# Patient Record
Sex: Female | Born: 1940 | Race: White | Hispanic: No | Marital: Married | Smoking: Never smoker
Health system: Southern US, Community
[De-identification: ages and names within clinical notes are randomized; demographics above are authoritative.]

## PROBLEM LIST (undated history)

## (undated) DIAGNOSIS — E785 Hyperlipidemia, unspecified: Secondary | ICD-10-CM

## (undated) DIAGNOSIS — G43909 Migraine, unspecified, not intractable, without status migrainosus: Secondary | ICD-10-CM

## (undated) DIAGNOSIS — C801 Malignant (primary) neoplasm, unspecified: Secondary | ICD-10-CM

## (undated) DIAGNOSIS — N8110 Cystocele, unspecified: Secondary | ICD-10-CM

## (undated) DIAGNOSIS — M1990 Unspecified osteoarthritis, unspecified site: Secondary | ICD-10-CM

## (undated) DIAGNOSIS — L4050 Arthropathic psoriasis, unspecified: Secondary | ICD-10-CM

## (undated) DIAGNOSIS — C50919 Malignant neoplasm of unspecified site of unspecified female breast: Secondary | ICD-10-CM

## (undated) DIAGNOSIS — M170 Bilateral primary osteoarthritis of knee: Secondary | ICD-10-CM

## (undated) DIAGNOSIS — N200 Calculus of kidney: Secondary | ICD-10-CM

## (undated) DIAGNOSIS — R011 Cardiac murmur, unspecified: Secondary | ICD-10-CM

## (undated) HISTORY — DX: Bilateral primary osteoarthritis of knee: M17.0

## (undated) HISTORY — DX: Calculus of kidney: N20.0

## (undated) HISTORY — PX: TONSILLECTOMY: SUR1361

## (undated) HISTORY — DX: Cardiac murmur, unspecified: R01.1

## (undated) HISTORY — DX: Unspecified osteoarthritis, unspecified site: M19.90

## (undated) HISTORY — PX: TYMPANOSTOMY TUBE PLACEMENT: SHX32

## (undated) HISTORY — DX: Cystocele, unspecified: N81.10

## (undated) HISTORY — PX: CATARACT EXTRACTION: SUR2

## (undated) HISTORY — PX: TOTAL KNEE ARTHROPLASTY: SHX125

## (undated) HISTORY — DX: Hyperlipidemia, unspecified: E78.5

## (undated) HISTORY — DX: Migraine, unspecified, not intractable, without status migrainosus: G43.909

## (undated) HISTORY — DX: Arthropathic psoriasis, unspecified: L40.50

## (undated) HISTORY — DX: Malignant (primary) neoplasm, unspecified: C80.1

---

## 1987-03-27 HISTORY — PX: BREAST CYST EXCISION: SHX579

## 1992-03-26 DIAGNOSIS — C50919 Malignant neoplasm of unspecified site of unspecified female breast: Secondary | ICD-10-CM

## 1992-03-26 HISTORY — PX: BREAST LUMPECTOMY: SHX2

## 1992-03-26 HISTORY — DX: Malignant neoplasm of unspecified site of unspecified female breast: C50.919

## 2009-03-26 LAB — HM COLONOSCOPY

## 2013-01-02 ENCOUNTER — Encounter: Payer: Self-pay | Admitting: Family

## 2013-01-02 ENCOUNTER — Encounter (INDEPENDENT_AMBULATORY_CARE_PROVIDER_SITE_OTHER): Payer: Self-pay

## 2013-01-02 ENCOUNTER — Ambulatory Visit (INDEPENDENT_AMBULATORY_CARE_PROVIDER_SITE_OTHER): Payer: Medicare HMO | Admitting: Family

## 2013-01-02 VITALS — BP 126/80 | HR 69 | Ht 64.5 in | Wt 146.0 lb

## 2013-01-02 DIAGNOSIS — E7800 Pure hypercholesterolemia, unspecified: Secondary | ICD-10-CM | POA: Insufficient documentation

## 2013-01-02 DIAGNOSIS — M8580 Other specified disorders of bone density and structure, unspecified site: Secondary | ICD-10-CM

## 2013-01-02 DIAGNOSIS — Z23 Encounter for immunization: Secondary | ICD-10-CM

## 2013-01-02 DIAGNOSIS — M899 Disorder of bone, unspecified: Secondary | ICD-10-CM

## 2013-01-02 DIAGNOSIS — Z853 Personal history of malignant neoplasm of breast: Secondary | ICD-10-CM

## 2013-01-02 DIAGNOSIS — L4050 Arthropathic psoriasis, unspecified: Secondary | ICD-10-CM

## 2013-01-02 DIAGNOSIS — M069 Rheumatoid arthritis, unspecified: Secondary | ICD-10-CM | POA: Insufficient documentation

## 2013-01-02 MED ORDER — LOVASTATIN 40 MG PO TABS: 40.0000 mg | ORAL_TABLET | Freq: Every day | ORAL | Status: AC

## 2013-01-02 MED ORDER — CLOBETASOL PROPIONATE 0.05 % EX SOLN: 1.0000 "application " | Freq: Two times a day (BID) | CUTANEOUS | Status: DC

## 2013-01-02 MED ORDER — CLOBETASOL PROPIONATE 0.05 % EX SOLN: 1.0000 "application " | Freq: Two times a day (BID) | CUTANEOUS | Status: AC

## 2013-01-02 NOTE — Progress Notes (Signed)
  Subjective:    Patient ID: Chelsea Mayo, female    DOB: 1940/07/15, 72 y.o.   MRN: [id number]  HPI 72 year old white female, nonsmoker and sent as a new patient to be established. She has a history of psoriatic arthritis, osteopenia, hyperlipidemia. She is relocating here from [place]. She tolerates her medications well. Was seeing her primary care provider there every 3 months last labs were on August. Last colonoscopy 2011. Bone density 5 years ago and had a mammogram in June 2014. Denies any concerns today.   Review of Systems  Constitutional: Negative.   HENT: Negative.   Respiratory: Negative.   Cardiovascular: Negative.   Gastrointestinal: Negative.   Endocrine: Negative.   Genitourinary: Negative.   Musculoskeletal: Negative.        Psoriatic arthritis controlled with methotrexate  Skin: Negative.        Psoriasis  Neurological: Negative.   Hematological: Negative.   Psychiatric/Behavioral: Negative.    Past Medical History  Diagnosis Date  . Arthritis   . Cancer     breast  . Hyperlipidemia   . Heart murmur   . Migraines   . Kidney stones   . Female bladder prolapse   . Psoriatic arthritis   . Osteoarthritis of both knees     History   Social History  . Marital Status: Married    Spouse Name: N/A    Number of Children: N/A  . Years of Education: N/A   Occupational History  . Not on file.   Social History Main Topics  . Smoking status: Never Smoker   . Smokeless tobacco: Not on file  . Alcohol Use: Yes  . Drug Use: No  . Sexual Activity: Not on file   Other Topics Concern  . Not on file   Social History Narrative  . No narrative on file    Past Surgical History  Procedure Laterality Date  . Breast lumpectomy    . Tonsillectomy    . Total knee arthroplasty    . Cataract extraction    . Tympanostomy tube placement      No family history on file.  Allergies  Allergen Reactions  . Morphine And Related   . Plaquenil [Hydroxychloroquine  Sulfate]     No current outpatient prescriptions on file prior to visit.   No current facility-administered medications on file prior to visit.    BP 126/80  Pulse 69  Ht 5' 4.5" (1.638 m)  Wt 146 lb (66.225 kg)  BMI 24.68 kg/m2chart    Objective:   Physical Exam  Constitutional: She is oriented to person, place, and time. She appears well-developed and well-nourished.  HENT:  Right Ear: External ear normal.  Left Ear: External ear normal.  Mouth/Throat: Oropharynx is clear and moist.  Neck: Normal range of motion.  Cardiovascular: Normal rate, regular rhythm and normal heart sounds.   Pulmonary/Chest: Effort normal and breath sounds normal.  Abdominal: Soft. Bowel sounds are normal.  Musculoskeletal: Normal range of motion.  Neurological: She is alert and oriented to person, place, and time.  Skin: Skin is warm and dry.  Psychiatric: She has a normal mood and affect.          Assessment & Plan:  Assessment: 1. Psoriatic arthritis 2. Osteopenia 3. Hyperlipidemia 4. History of breast cancer  Plan: Refer to rheumatology for management of methotrexate for psoriatic arthritis. Continue calcium and vitamin D. Continue lovastatin prescription sent to the pharmacy. Herself to diet, exercise, monthly self breast exams.

## 2013-01-02 NOTE — Addendum Note (Signed)
Addended by: Beverely Low on: 01/02/2013 10:04 AM   Modules accepted: Orders

## 2013-01-07 ENCOUNTER — Encounter: Payer: Self-pay | Admitting: Family

## 2013-04-07 ENCOUNTER — Ambulatory Visit: Payer: Medicare HMO | Admitting: Family

## 2013-05-13 ENCOUNTER — Telehealth: Payer: Self-pay | Admitting: Family

## 2013-05-13 NOTE — Telephone Encounter (Signed)
Patient Information:  Caller Name: Samyra  Phone: [phone]  Patient: Alicea, Wente  Gender: Female  DOB: 07/11/1940  Age: 73 Years  PCP: [facility])  Office Follow Up:  Does the office need to follow up with this patient?: No  Instructions For The Office: N/A   Symptoms  Reason For Call & Symptoms: Jenniger states she had fever and "feelling bad" on 05/13/13. States she went to [facility] today 05/13/13 and tested positive for influenza. States she was ordered Tamiflu and Benzonatate for cough / sore throat.  No further needs at this time. Advised Shandi to callback if needed.  Reviewed Health History In EMR: Yes  Reviewed Medications In EMR: Yes  Reviewed Allergies In EMR: Yes  Reviewed Surgeries / Procedures: Yes  Date of Onset of Symptoms: Unknown  Guideline(s) Used:  No Protocol Available - Sick Adult  Disposition Per Guideline:   Home Care  Reason For Disposition Reached:   Patient's symptoms are safe to treat at home per nursing judgment  Advice Given:  N/A  Patient Will Follow Care Advice:  YES

## 2013-05-15 NOTE — Telephone Encounter (Signed)
fyi

## 2013-11-04 ENCOUNTER — Other Ambulatory Visit: Payer: Self-pay | Admitting: Physician Assistant

## 2013-11-04 DIAGNOSIS — Z780 Asymptomatic menopausal state: Secondary | ICD-10-CM

## 2013-11-04 DIAGNOSIS — Z1231 Encounter for screening mammogram for malignant neoplasm of breast: Secondary | ICD-10-CM

## 2013-12-02 ENCOUNTER — Ambulatory Visit
Admission: RE | Admit: 2013-12-02 | Discharge: 2013-12-02 | Disposition: A | Discharge status: Home / Self Care | Payer: Self-pay | Source: Ambulatory Visit | Attending: Physician Assistant | Admitting: Physician Assistant

## 2013-12-02 ENCOUNTER — Other Ambulatory Visit: Payer: Self-pay | Admitting: Physician Assistant

## 2013-12-02 ENCOUNTER — Ambulatory Visit
Admission: RE | Admit: 2013-12-02 | Discharge: 2013-12-02 | Disposition: A | Discharge status: Home / Self Care | Payer: Medicare HMO | Source: Ambulatory Visit | Attending: Physician Assistant | Admitting: Physician Assistant

## 2013-12-02 DIAGNOSIS — Z853 Personal history of malignant neoplasm of breast: Secondary | ICD-10-CM

## 2013-12-02 DIAGNOSIS — Z1231 Encounter for screening mammogram for malignant neoplasm of breast: Secondary | ICD-10-CM

## 2014-11-02 ENCOUNTER — Other Ambulatory Visit: Payer: Self-pay | Admitting: Gastroenterology

## 2014-11-02 ENCOUNTER — Ambulatory Visit (HOSPITAL_COMMUNITY): Payer: Medicare HMO

## 2014-11-02 ENCOUNTER — Other Ambulatory Visit (HOSPITAL_COMMUNITY): Payer: Self-pay | Admitting: Gastroenterology

## 2014-11-02 ENCOUNTER — Ambulatory Visit (HOSPITAL_COMMUNITY)
Admission: RE | Admit: 2014-11-02 | Discharge: 2014-11-02 | Disposition: A | Discharge status: Home / Self Care | Payer: Medicare HMO | Source: Ambulatory Visit | Attending: Gastroenterology | Admitting: Gastroenterology

## 2014-11-02 ENCOUNTER — Encounter (HOSPITAL_COMMUNITY): Payer: Self-pay

## 2014-11-02 DIAGNOSIS — R933 Abnormal findings on diagnostic imaging of other parts of digestive tract: Secondary | ICD-10-CM

## 2014-11-02 DIAGNOSIS — K8020 Calculus of gallbladder without cholecystitis without obstruction: Secondary | ICD-10-CM | POA: Diagnosis not present

## 2014-11-02 DIAGNOSIS — K449 Diaphragmatic hernia without obstruction or gangrene: Secondary | ICD-10-CM | POA: Diagnosis not present

## 2014-11-02 DIAGNOSIS — C184 Malignant neoplasm of transverse colon: Secondary | ICD-10-CM | POA: Insufficient documentation

## 2014-11-02 DIAGNOSIS — Z853 Personal history of malignant neoplasm of breast: Secondary | ICD-10-CM | POA: Insufficient documentation

## 2014-11-02 DIAGNOSIS — I700 Atherosclerosis of aorta: Secondary | ICD-10-CM | POA: Insufficient documentation

## 2014-11-02 DIAGNOSIS — D1809 Hemangioma of other sites: Secondary | ICD-10-CM | POA: Insufficient documentation

## 2014-11-02 DIAGNOSIS — R918 Other nonspecific abnormal finding of lung field: Secondary | ICD-10-CM | POA: Diagnosis not present

## 2014-11-02 DIAGNOSIS — R160 Hepatomegaly, not elsewhere classified: Secondary | ICD-10-CM | POA: Diagnosis not present

## 2014-11-02 DIAGNOSIS — N281 Cyst of kidney, acquired: Secondary | ICD-10-CM | POA: Diagnosis not present

## 2014-11-02 DIAGNOSIS — K5730 Diverticulosis of large intestine without perforation or abscess without bleeding: Secondary | ICD-10-CM | POA: Insufficient documentation

## 2014-11-02 DIAGNOSIS — C189 Malignant neoplasm of colon, unspecified: Secondary | ICD-10-CM

## 2014-11-02 LAB — POCT I-STAT CREATININE: CREATININE: 0.8 mg/dL (ref 0.44–1.00)

## 2014-11-02 MED ORDER — IOHEXOL 300 MG/ML  SOLN
100.0000 mL | Freq: Once | INTRAMUSCULAR | Status: AC | PRN
  Administered 2014-11-02: 100 mL via INTRAVENOUS

## 2014-11-02 MED ORDER — IOHEXOL 300 MG/ML  SOLN
50.0000 mL | Freq: Once | INTRAMUSCULAR | Status: AC | PRN
  Administered 2014-11-02: 50 mL via ORAL

## 2014-11-12 ENCOUNTER — Telehealth: Payer: Self-pay | Admitting: *Deleted

## 2014-11-12 ENCOUNTER — Other Ambulatory Visit: Payer: Self-pay | Admitting: General Surgery

## 2014-11-12 DIAGNOSIS — C799 Secondary malignant neoplasm of unspecified site: Secondary | ICD-10-CM

## 2014-11-12 NOTE — Telephone Encounter (Signed)
Oncology Nurse Navigator Documentation  Oncology Nurse Navigator Flowsheets 11/12/2014  Referral date to RadOnc/MedOnc 11/11/2014  Navigator Encounter Type Introductory phone call  Called and made her aware that referral has been received. Inquired if she wants to be seen at [facility] by Dr. Whitney Muse since she lives in [place]? She reports they are on outskirts of [place] is just as close for her. Requests to be seen in [place]. Made her aware that she will be called after MD reviews chart information.

## 2014-11-17 ENCOUNTER — Telehealth: Payer: Self-pay | Admitting: *Deleted

## 2014-11-17 NOTE — Telephone Encounter (Signed)
  Oncology Nurse Navigator Documentation    Navigator Encounter Type: Telephone (11/17/14 1149)  Left VM for patient to call and confirm her appointment with Dr. Burr Medico on 11/22/14 at 3:15 pm. Will also send her a MyChart message.

## 2014-11-17 NOTE — Telephone Encounter (Signed)
Return call from [place] to confirm her appointment with Dr. Burr Medico on 11/22/14.

## 2014-11-19 ENCOUNTER — Other Ambulatory Visit: Payer: Self-pay

## 2014-11-19 DIAGNOSIS — Z1231 Encounter for screening mammogram for malignant neoplasm of breast: Secondary | ICD-10-CM

## 2014-11-22 ENCOUNTER — Ambulatory Visit: Payer: Medicare HMO | Admitting: Hematology

## 2014-11-22 ENCOUNTER — Ambulatory Visit: Payer: Medicare HMO

## 2014-12-06 ENCOUNTER — Ambulatory Visit
Admission: RE | Admit: 2014-12-06 | Discharge: 2014-12-06 | Disposition: A | Discharge status: Home / Self Care | Payer: Medicare HMO | Source: Ambulatory Visit

## 2014-12-06 DIAGNOSIS — Z1231 Encounter for screening mammogram for malignant neoplasm of breast: Secondary | ICD-10-CM

## 2015-01-05 ENCOUNTER — Telehealth: Payer: Self-pay | Admitting: *Deleted

## 2015-01-05 NOTE — Telephone Encounter (Signed)
Noted patient saw Dr. Karle Starch at [facility] and is planning for [facility] and [facility]. Sent Mychart message to confirm she will follow up there instead of with Dr. Burr Medico.

## 2015-11-03 ENCOUNTER — Other Ambulatory Visit: Payer: Self-pay | Admitting: Physician Assistant

## 2015-11-03 DIAGNOSIS — Z1231 Encounter for screening mammogram for malignant neoplasm of breast: Secondary | ICD-10-CM

## 2015-12-08 ENCOUNTER — Ambulatory Visit
Admission: RE | Admit: 2015-12-08 | Discharge: 2015-12-08 | Disposition: A | Discharge status: Home / Self Care | Payer: Medicare HMO | Source: Ambulatory Visit | Attending: Physician Assistant | Admitting: Physician Assistant

## 2015-12-08 DIAGNOSIS — Z1231 Encounter for screening mammogram for malignant neoplasm of breast: Secondary | ICD-10-CM

## 2016-11-13 ENCOUNTER — Other Ambulatory Visit: Payer: Self-pay | Admitting: Physician Assistant

## 2016-11-13 DIAGNOSIS — Z1231 Encounter for screening mammogram for malignant neoplasm of breast: Secondary | ICD-10-CM

## 2016-12-19 ENCOUNTER — Ambulatory Visit
Admission: RE | Admit: 2016-12-19 | Discharge: 2016-12-19 | Disposition: A | Discharge status: Home / Self Care | Payer: Medicare HMO | Source: Ambulatory Visit | Attending: Physician Assistant | Admitting: Physician Assistant

## 2016-12-19 DIAGNOSIS — Z1231 Encounter for screening mammogram for malignant neoplasm of breast: Secondary | ICD-10-CM

## 2016-12-19 HISTORY — DX: Malignant neoplasm of unspecified site of unspecified female breast: C50.919

## 2019-06-30 ENCOUNTER — Emergency Department (HOSPITAL_COMMUNITY): Payer: Medicare HMO

## 2019-06-30 ENCOUNTER — Encounter (HOSPITAL_COMMUNITY): Payer: Self-pay | Admitting: Emergency Medicine

## 2019-06-30 ENCOUNTER — Other Ambulatory Visit: Payer: Self-pay

## 2019-06-30 ENCOUNTER — Inpatient Hospital Stay (HOSPITAL_COMMUNITY)
Admission: EM | Admit: 2019-06-30 | Discharge: 2019-07-25 | DRG: 871 | Disposition: E | Payer: Medicare HMO | Attending: Internal Medicine | Admitting: Internal Medicine

## 2019-06-30 DIAGNOSIS — J9600 Acute respiratory failure, unspecified whether with hypoxia or hypercapnia: Secondary | ICD-10-CM

## 2019-06-30 DIAGNOSIS — Z853 Personal history of malignant neoplasm of breast: Secondary | ICD-10-CM

## 2019-06-30 DIAGNOSIS — F419 Anxiety disorder, unspecified: Secondary | ICD-10-CM | POA: Diagnosis present

## 2019-06-30 DIAGNOSIS — R0602 Shortness of breath: Secondary | ICD-10-CM | POA: Diagnosis not present

## 2019-06-30 DIAGNOSIS — Z7189 Other specified counseling: Secondary | ICD-10-CM

## 2019-06-30 DIAGNOSIS — G92 Toxic encephalopathy: Secondary | ICD-10-CM | POA: Diagnosis present

## 2019-06-30 DIAGNOSIS — R338 Other retention of urine: Secondary | ICD-10-CM | POA: Diagnosis not present

## 2019-06-30 DIAGNOSIS — A419 Sepsis, unspecified organism: Secondary | ICD-10-CM | POA: Diagnosis not present

## 2019-06-30 DIAGNOSIS — G9341 Metabolic encephalopathy: Secondary | ICD-10-CM | POA: Diagnosis present

## 2019-06-30 DIAGNOSIS — T402X1A Poisoning by other opioids, accidental (unintentional), initial encounter: Secondary | ICD-10-CM | POA: Diagnosis present

## 2019-06-30 DIAGNOSIS — D329 Benign neoplasm of meninges, unspecified: Secondary | ICD-10-CM | POA: Diagnosis present

## 2019-06-30 DIAGNOSIS — R339 Retention of urine, unspecified: Secondary | ICD-10-CM | POA: Diagnosis present

## 2019-06-30 DIAGNOSIS — Z7952 Long term (current) use of systemic steroids: Secondary | ICD-10-CM

## 2019-06-30 DIAGNOSIS — Z20822 Contact with and (suspected) exposure to covid-19: Secondary | ICD-10-CM | POA: Diagnosis present

## 2019-06-30 DIAGNOSIS — M1990 Unspecified osteoarthritis, unspecified site: Secondary | ICD-10-CM | POA: Diagnosis present

## 2019-06-30 DIAGNOSIS — E876 Hypokalemia: Secondary | ICD-10-CM | POA: Diagnosis not present

## 2019-06-30 DIAGNOSIS — T40601A Poisoning by unspecified narcotics, accidental (unintentional), initial encounter: Secondary | ICD-10-CM | POA: Diagnosis present

## 2019-06-30 DIAGNOSIS — G43909 Migraine, unspecified, not intractable, without status migrainosus: Secondary | ICD-10-CM | POA: Diagnosis present

## 2019-06-30 DIAGNOSIS — T402X4A Poisoning by other opioids, undetermined, initial encounter: Secondary | ICD-10-CM | POA: Diagnosis not present

## 2019-06-30 DIAGNOSIS — G8929 Other chronic pain: Secondary | ICD-10-CM | POA: Diagnosis present

## 2019-06-30 DIAGNOSIS — E7800 Pure hypercholesterolemia, unspecified: Secondary | ICD-10-CM | POA: Diagnosis present

## 2019-06-30 DIAGNOSIS — C189 Malignant neoplasm of colon, unspecified: Secondary | ICD-10-CM | POA: Diagnosis present

## 2019-06-30 DIAGNOSIS — R509 Fever, unspecified: Secondary | ICD-10-CM | POA: Diagnosis present

## 2019-06-30 DIAGNOSIS — Z66 Do not resuscitate: Secondary | ICD-10-CM | POA: Diagnosis present

## 2019-06-30 DIAGNOSIS — C799 Secondary malignant neoplasm of unspecified site: Secondary | ICD-10-CM | POA: Diagnosis present

## 2019-06-30 DIAGNOSIS — G894 Chronic pain syndrome: Secondary | ICD-10-CM | POA: Diagnosis present

## 2019-06-30 DIAGNOSIS — L4050 Arthropathic psoriasis, unspecified: Secondary | ICD-10-CM | POA: Diagnosis present

## 2019-06-30 DIAGNOSIS — G893 Neoplasm related pain (acute) (chronic): Secondary | ICD-10-CM | POA: Diagnosis present

## 2019-06-30 DIAGNOSIS — Z885 Allergy status to narcotic agent status: Secondary | ICD-10-CM

## 2019-06-30 DIAGNOSIS — Z515 Encounter for palliative care: Secondary | ICD-10-CM | POA: Diagnosis not present

## 2019-06-30 DIAGNOSIS — J189 Pneumonia, unspecified organism: Secondary | ICD-10-CM | POA: Diagnosis present

## 2019-06-30 DIAGNOSIS — T402X5A Adverse effect of other opioids, initial encounter: Secondary | ICD-10-CM | POA: Diagnosis present

## 2019-06-30 DIAGNOSIS — G9340 Encephalopathy, unspecified: Secondary | ICD-10-CM | POA: Diagnosis present

## 2019-06-30 DIAGNOSIS — Z9221 Personal history of antineoplastic chemotherapy: Secondary | ICD-10-CM

## 2019-06-30 DIAGNOSIS — Z96659 Presence of unspecified artificial knee joint: Secondary | ICD-10-CM | POA: Diagnosis present

## 2019-06-30 DIAGNOSIS — E785 Hyperlipidemia, unspecified: Secondary | ICD-10-CM | POA: Diagnosis present

## 2019-06-30 LAB — COMPREHENSIVE METABOLIC PANEL
ALT: 18 U/L (ref 0–44)
AST: 27 U/L (ref 15–41)
Albumin: 4.1 g/dL (ref 3.5–5.0)
Alkaline Phosphatase: 81 U/L (ref 38–126)
Anion gap: 12 (ref 5–15)
BUN: 17 mg/dL (ref 8–23)
CO2: 24 mmol/L (ref 22–32)
Calcium: 9.3 mg/dL (ref 8.9–10.3)
Chloride: 103 mmol/L (ref 98–111)
Creatinine, Ser: 0.79 mg/dL (ref 0.44–1.00)
GFR calc Af Amer: >60 mL/min (ref >60)
GFR calc non Af Amer: >60 mL/min (ref >60)
Glucose, Bld: 120 mg/dL — ABNORMAL HIGH (ref 70–99)
Potassium: 3.7 mmol/L (ref 3.5–5.1)
Sodium: 139 mmol/L (ref 135–145)
Total Bilirubin: 0.4 mg/dL (ref 0.3–1.2)
Total Protein: 8.1 g/dL (ref 6.5–8.1)

## 2019-06-30 LAB — CBC
HCT: 45.4 % (ref 36.0–46.0)
Hemoglobin: 13.2 g/dL (ref 12.0–15.0)
MCH: 26.7 pg (ref 26.0–34.0)
MCHC: 29.1 g/dL — ABNORMAL LOW (ref 30.0–36.0)
MCV: 91.9 fL (ref 80.0–100.0)
Platelets: 150 10*3/uL (ref 150–400)
RBC: 4.94 MIL/uL (ref 3.87–5.11)
RDW: 15.2 % (ref 11.5–15.5)
WBC: 10 10*3/uL (ref 4.0–10.5)
nRBC: 0 % (ref 0.0–0.2)

## 2019-06-30 LAB — URINALYSIS, ROUTINE W REFLEX MICROSCOPIC
Bilirubin Urine: NEGATIVE
Glucose, UA: NEGATIVE mg/dL
Hgb urine dipstick: NEGATIVE
Ketones, ur: NEGATIVE mg/dL
Leukocytes,Ua: NEGATIVE
Nitrite: NEGATIVE
Protein, ur: NEGATIVE mg/dL
Specific Gravity, Urine: 1.009 (ref 1.005–1.030)
pH: 5 (ref 5.0–8.0)

## 2019-06-30 LAB — LACTIC ACID, PLASMA
Lactic Acid, Venous: 2.4 mmol/L (ref 0.5–1.9)
Lactic Acid, Venous: 2.8 mmol/L (ref 0.5–1.9)
Lactic Acid, Venous: 3.1 mmol/L (ref 0.5–1.9)

## 2019-06-30 LAB — ACETAMINOPHEN LEVEL: Acetaminophen (Tylenol), Serum: <10 ug/mL — ABNORMAL LOW (ref 10–30)

## 2019-06-30 LAB — SALICYLATE LEVEL: Salicylate Lvl: <7 mg/dL — ABNORMAL LOW (ref 7.0–30.0)

## 2019-06-30 LAB — ETHANOL: Alcohol, Ethyl (B): <10 mg/dL (ref <10)

## 2019-06-30 LAB — RESPIRATORY PANEL BY RT PCR (FLU A&B, COVID)
Influenza A by PCR: NEGATIVE
Influenza B by PCR: NEGATIVE
SARS Coronavirus 2 by RT PCR: NEGATIVE

## 2019-06-30 LAB — CK: Total CK: 46 U/L (ref 38–234)

## 2019-06-30 MED ORDER — ACETAMINOPHEN 325 MG PO TABS: 650.0000 mg | ORAL_TABLET | Freq: Four times a day (QID) | ORAL | Status: DC | PRN

## 2019-06-30 MED ORDER — NALOXONE HCL 0.4 MG/ML IJ SOLN
INTRAMUSCULAR | Status: AC
  Administered 2019-06-30: 10:00:00 0.4 mg via INTRAVENOUS
  Filled 2019-06-30: qty 1

## 2019-06-30 MED ORDER — ONDANSETRON HCL 4 MG/2ML IJ SOLN: 4.0000 mg | Freq: Four times a day (QID) | INTRAMUSCULAR | Status: DC | PRN

## 2019-06-30 MED ORDER — SODIUM CHLORIDE 0.9 % IV BOLUS
500.0000 mL | Freq: Once | INTRAVENOUS | Status: AC
  Administered 2019-06-30: 500 mL via INTRAVENOUS

## 2019-06-30 MED ORDER — KETOROLAC TROMETHAMINE 30 MG/ML IJ SOLN
30.0000 mg | Freq: Once | INTRAMUSCULAR | Status: AC
  Administered 2019-06-30: 30 mg via INTRAVENOUS
  Filled 2019-06-30: qty 1

## 2019-06-30 MED ORDER — NALOXONE HCL 4 MG/10ML IJ SOLN
1.5000 mg/h | INTRAVENOUS | Status: DC
  Administered 2019-06-30 (×2): 1.5 mg/h via INTRAVENOUS
  Filled 2019-06-30: qty 10

## 2019-06-30 MED ORDER — LACTATED RINGERS IV SOLN: INTRAVENOUS | Status: DC

## 2019-06-30 MED ORDER — NALOXONE HCL 4 MG/10ML IJ SOLN
1.0000 mg/h | INTRAVENOUS | Status: DC
  Administered 2019-06-30: 1 mg/h via INTRAVENOUS
  Filled 2019-06-30: qty 4
  Filled 2019-06-30: qty 10

## 2019-06-30 MED ORDER — ENOXAPARIN SODIUM 40 MG/0.4ML ~~LOC~~ SOLN
40.0000 mg | SUBCUTANEOUS | Status: DC
  Administered 2019-06-30 – 2019-07-02 (×3): 40 mg via SUBCUTANEOUS
  Filled 2019-06-30 (×3): qty 0.4

## 2019-06-30 MED ORDER — VANCOMYCIN HCL 1250 MG/250ML IV SOLN
1250.0000 mg | INTRAVENOUS | Status: DC
  Administered 2019-06-30 – 2019-07-01 (×2): 1250 mg via INTRAVENOUS
  Filled 2019-06-30 (×2): qty 250

## 2019-06-30 MED ORDER — NALOXONE HCL 2 MG/2ML IJ SOSY
PREFILLED_SYRINGE | INTRAMUSCULAR | Status: AC
  Filled 2019-06-30: qty 8

## 2019-06-30 MED ORDER — LACTATED RINGERS IV BOLUS
1000.0000 mL | Freq: Once | INTRAVENOUS | Status: AC
  Administered 2019-06-30: 1000 mL via INTRAVENOUS

## 2019-06-30 MED ORDER — NALOXONE HCL 0.4 MG/ML IJ SOLN
0.4000 mg | INTRAMUSCULAR | Status: DC | PRN
  Administered 2019-06-30 (×3): 0.4 mg via INTRAVENOUS
  Filled 2019-06-30 (×3): qty 1

## 2019-06-30 MED ORDER — LORAZEPAM 2 MG/ML IJ SOLN
0.5000 mg | Freq: Once | INTRAMUSCULAR | Status: AC
  Administered 2019-06-30: 0.5 mg via INTRAVENOUS
  Filled 2019-06-30: qty 1

## 2019-06-30 MED ORDER — ONDANSETRON HCL 4 MG PO TABS: 4.0000 mg | ORAL_TABLET | Freq: Four times a day (QID) | ORAL | Status: DC | PRN

## 2019-06-30 MED ORDER — SODIUM CHLORIDE 0.9 % IV SOLN
2.0000 g | Freq: Two times a day (BID) | INTRAVENOUS | Status: DC
  Administered 2019-06-30 – 2019-07-01 (×2): 2 g via INTRAVENOUS
  Filled 2019-06-30 (×2): qty 2

## 2019-06-30 MED ORDER — CHLORHEXIDINE GLUCONATE CLOTH 2 % EX PADS
6.0000 | MEDICATED_PAD | Freq: Every day | CUTANEOUS | Status: DC
  Administered 2019-06-30 – 2019-07-03 (×4): 6 via TOPICAL

## 2019-06-30 MED ORDER — ACETAMINOPHEN 650 MG RE SUPP
650.0000 mg | Freq: Four times a day (QID) | RECTAL | Status: DC | PRN
  Administered 2019-06-30 – 2019-07-03 (×4): 650 mg via RECTAL
  Filled 2019-06-30 (×4): qty 1

## 2019-06-30 NOTE — Progress Notes (Addendum)
Pharmacy Antibiotic Note  Chelsea Mayo is a 79 y.o. female admitted on 07/11/2019 with sepsis.  Pharmacy has been consulted for vanc/cefepime dosing.  Pt with a hx of colon CA who presented to the ED with AMS. This could also be attributed to medication overdose according to MD. She was found to be febrile in the ED. He lactate came back elevated so vanc and cefepime ordered to r/o sepsis.   Scr 0.79 ALT wnl COVID neg Wbc 10  Plan: Vanc 1.25g IV q24>>AUC 429, scr 0.79 Cefepime 2g IV q12 Levels as needed F/u with MRSA PCR  Height: 5\' 4"  (162.6 cm) Weight: 64.4 kg (141 lb 15.6 oz) IBW/kg (Calculated) : 54.7  Temp (24hrs), Avg:99.4 F (37.4 C), Min:97.2 F (36.2 C), Max:101.8 F (38.8 C)  Recent Labs  Lab 06/25/2019 1021 07/13/2019 1821  WBC 10.0  --   CREATININE 0.79  --   LATICACIDVEN 2.4* 2.8*    Estimated Creatinine Clearance: 50.9 mL/min (by C-G formula based on SCr of 0.79 mg/dL).    Allergies  Allergen Reactions  . Morphine And Related   . Plaquenil [Hydroxychloroquine Sulfate]   . Trazodone Other (See Comments)    Felt very weird    Antimicrobials this admission: 4/6 cefepime>> 4/6 vanc>>  Dose adjustments this admission:   Microbiology results: 4/6 MRSA>> 4/6 Flu neg 4/6 COVID neg 4/6 blood>>  Onnie Boer, PharmD, [place], AAHIVP, CPP Infectious Disease Pharmacist 07/13/2019 7:20 PM

## 2019-06-30 NOTE — ED Provider Notes (Signed)
[facility] Emergency Department Provider Note MRN:  [id number]  Arrival date & time: 07/17/2019     Chief Complaint   Unresponsiveness History of Present Illness   Chelsea Mayo is a 79 y.o. year-old female with a history of metastatic colon cancer presenting to the ED with chief complaint of unresponsiveness.  History obtained from EMS and patient's husband as patient is unresponsive.  Patient has multiple sedating prescriptions at home, including oxycodone and Valium to control her pain related to her cancer.  Husband and patient sleep in different rooms so as to not disturb each other's sleep.  Patient did not wake up at her normal time, husband checked on her and she was on the ground next to her bed, breathing shallowly, not waking up.  EMS arrived and patient required bag-valve-mask, became more responsive with stronger breaths after dose of intravenous Narcan.  I was unable to obtain an accurate HPI, PMH, or ROS due to the patient's unresponsiveness.  Level 5 caveat.  Review of Systems  Positive for unresponsiveness, altered mental status.  Patient's Health History    Past Medical History:  Diagnosis Date  . Arthritis   . breast ca dx'd 1995   right lumpectomy w/ xrt  . Breast cancer ([place]) 1994   right breast  . Female bladder prolapse   . Heart murmur   . Hyperlipidemia   . Kidney stones   . Migraines   . Osteoarthritis of both knees   . Psoriatic arthritis [facility])     Past Surgical History:  Procedure Laterality Date  . BREAST CYST EXCISION Right 1989  . BREAST LUMPECTOMY Right 1994  . CATARACT EXTRACTION    . TONSILLECTOMY    . TOTAL KNEE ARTHROPLASTY    . TYMPANOSTOMY TUBE PLACEMENT      History reviewed. No pertinent family history.  Social History   Socioeconomic History  . Marital status: Married    Spouse name: Not on file  . Number of children: Not on file  . Years of education: Not on file  . Highest education level: Not on  file  Occupational History  . Not on file  Tobacco Use  . Smoking status: Never Smoker  . Smokeless tobacco: Never Used  Substance and Sexual Activity  . Alcohol use: Yes  . Drug use: No  . Sexual activity: Not on file  Other Topics Concern  . Not on file  Social History Narrative  . Not on file   Social Determinants of Health   Financial Resource Strain:   . Difficulty of Paying Living Expenses:   Food Insecurity:   . Worried About Charity fundraiser in the Last Year:   . Arboriculturist in the Last Year:   Transportation Needs:   . Film/video editor (Medical):   Marland Kitchen Lack of Transportation (Non-Medical):   Physical Activity:   . Days of Exercise per Week:   . Minutes of Exercise per Session:   Stress:   . Feeling of Stress :   Social Connections:   . Frequency of Communication with Friends and Family:   . Frequency of Social Gatherings with Friends and Family:   . Attends Religious Services:   . Active Member of Clubs or Organizations:   . Attends Archivist Meetings:   Marland Kitchen Marital Status:   Intimate Partner Violence:   . Fear of Current or Ex-Partner:   . Emotionally Abused:   Marland Kitchen Physically Abused:   . Sexually Abused:  Physical Exam   Vitals:   07/03/2019 0949 06/29/2019 1000  BP:  98/84  Pulse:  70  Resp:  20  Temp: (!) 97.3 F (36.3 C)   SpO2:  95%    CONSTITUTIONAL: Ill-appearing NEURO: Somnolent, difficult to wake, withdrawals extremities from pain EYES: Pupils 1 mm ENT/NECK:  no LAD, no JVD CARDIO: Regular rate, well-perfused, normal S1 and S2 PULM: Infrequent shallow breaths GI/GU:  normal bowel sounds, non-distended, non-tender MSK/SPINE:  No gross deformities, no edema SKIN:  no rash, atraumatic PSYCH:  Appropriate speech and behavior  *Additional and/or pertinent findings included in MDM below  Diagnostic and Interventional Summary    EKG Interpretation  Date/Time:  Tuesday June 30 2019 09:30:21 EDT Ventricular Rate:   75 PR Interval:    QRS Duration: 106 QT Interval:  410 QTC Calculation: 458 R Axis:   -5 Text Interpretation: Sinus rhythm Consider left atrial enlargement Abnormal R-wave progression, early transition Nonspecific T abnormalities, lateral leads No previous ECGs available Confirmed by Gerlene Fee [phone]) on 06/28/2019 9:37:26 AM      Labs Reviewed  CBC - Abnormal; Notable for the following components:      Result Value   MCHC 29.1 (*)    All other components within normal limits  COMPREHENSIVE METABOLIC PANEL - Abnormal; Notable for the following components:   Glucose, Bld 120 (*)    All other components within normal limits  SALICYLATE LEVEL - Abnormal; Notable for the following components:   Salicylate Lvl [id number] (*)    All other components within normal limits  ACETAMINOPHEN LEVEL - Abnormal; Notable for the following components:   Acetaminophen (Tylenol), Serum <10 (*)    All other components within normal limits  LACTIC ACID, PLASMA - Abnormal; Notable for the following components:   Lactic Acid, Venous 2.4 (*)    All other components within normal limits  RESPIRATORY PANEL BY RT PCR (FLU A&B, COVID)  ETHANOL  URINALYSIS, ROUTINE W REFLEX MICROSCOPIC  CK    DG Chest 1 View  Final Result    CT HEAD WO CONTRAST  Final Result      Medications  naloxone (NARCAN) injection 0.4 mg (0.4 mg Intravenous Given 07/03/2019 1100)  naloxone HCl (NARCAN) 4 mg in dextrose 5 % 250 mL infusion (has no administration in time range)  sodium chloride 0.9 % bolus 500 mL (500 mLs Intravenous New Bag/Given 07/07/2019 1007)     Procedures  /  Critical Care .Critical Care Performed by: Maudie Flakes, MD Authorized by: Maudie Flakes, MD   Critical care provider statement:    Critical care time (minutes):  45   Critical care was necessary to treat or prevent imminent or life-threatening deterioration of the following conditions:  Toxidrome and respiratory failure   Critical care was time  spent personally by me on the following activities:  Discussions with consultants, evaluation of patient's response to treatment, examination of patient, ordering and performing treatments and interventions, ordering and review of laboratory studies, ordering and review of radiographic studies, pulse oximetry, re-evaluation of patient's condition, obtaining history from patient or surrogate and review of old charts    ED Course and Medical Decision Making  I have reviewed the triage vital signs, the nursing notes, and pertinent available records from the EMR.  Pertinent labs & imaging results that were available during my care of the patient were reviewed by me and considered in my medical decision making (see below for details).     Patient arriving  very somnolent, reportedly responded well to Narcan but on arrival is difficult to wake, pupils pinpoint, and frequent shallow breaths.  An additional IV dose of 0.4 mg Narcan was provided shortly after arrival, and patient responded well with eyes opened, still altered but with stronger respiratory effort.  Will need very close monitoring for presumed opioid overdose, likely unintentional in the setting of chronic pain from cancer.  Question of unwitnessed fall, will also obtain CT head, laboratory evaluation given that patient endorsed to husband that she felt generally unwell last night.  11:20 AM update: Patient has required 5 more doses of 0.4 mg IV Narcan to maintain her respiratory effort.  Narcan drip ordered at 1 mg/h, admitted to hospitalist service for further care, likely stepdown unit.  Barth Kirks. Sedonia Small, [place] [email or web address]  Final Clinical Impressions(s) / ED Diagnoses     ICD-10-CM   1. Opioid overdose, undetermined intent, initial encounter ([place])  T40.2X4A   2. SOB (shortness of breath)  R06.02 DG Chest 1 View    DG Chest 1 View  3. Acute respiratory failure, unspecified  whether with hypoxia or hypercapnia ([place])  J96.00     ED Discharge Orders    None       Discharge Instructions Discussed with and Provided to Patient:   Discharge Instructions   None       Maudie Flakes, MD 07/09/2019 1120

## 2019-06-30 NOTE — Progress Notes (Signed)
Rectal temp checked and showed 101.8. Tylenol suppository administered. Will recheck temp. Pt noted to also have not made any urine since arrival to unit at 1300. Bladder scan revealed >454 urine retention. In and Out cath performed per protocol. Total of 700 mL urine output collected. Dr. Roderic Palau notified.

## 2019-06-30 NOTE — ED Notes (Signed)
Pt in bed, monitors remain in place, bed in low position, call bell within reach, pt opens eyes to loud verbal stim or tactile stim.

## 2019-06-30 NOTE — Progress Notes (Signed)
Patient has not urinated this shift, bladder can showed >362 mL urine. Patient also very restless and agitated, midlevel aware. Orders placed and followed.

## 2019-06-30 NOTE — ED Triage Notes (Signed)
PT brought in by [facility] today from home. EMS reported her husband called EMS today after finding the patient in the floor and not responsive. On EMS arrival pt was being bagged with bag-valve mask by first responders and EMS gave .4 Narcan IV and pt began breathing more on her own. PT has stage IV colon cancer per EMS and is prescribed Roxicodone and valium at home. PT did vomit at home with EMS. PT arrived on 4L n/c oxygen but still lethargic and oxygen sats were in the 80s. EDP at bedside at this time.

## 2019-06-30 NOTE — Plan of Care (Signed)
CRITICAL VALUE ALERT  Critical Value:  Lactic 2.4  Date & Time Notied:  07/24/2019 1106  Provider Notified: Abbott Pao, MD   Orders Received/Actions taken: See new orders

## 2019-06-30 NOTE — Progress Notes (Signed)
CRITICAL VALUE ALERT  Critical Value:  Lactic acid- 3.1  Date & Time Notied:  06/27/2019, 2249  Provider Notified: Sharlet Salina  Orders Received/Actions taken:

## 2019-06-30 NOTE — H&P (Addendum)
History and Physical    Tykerria Seanor [id number] DOB: [birth year] DOA: 06/29/2019  PCP: Kennyth Arnold, FNP  Patient coming from: Home  I have personally briefly reviewed patient's old medical records in [place]  Chief Complaint: Altered mental status  HPI: Lizette Goldwater is a 79 y.o. female with medical history significant of colon cancer stage IIIb, no longer on treatment, hyperlipidemia, anxiety, chronic pain related to malignancy, psoriatic arthritis, was brought to the hospital with altered mental status.  Patient's husband reports that yesterday evening, she was in her usual state of health.  She did complain of some nausea.  It is unclear whether she took Zofran or accidentally took extra oxycodone for symptoms.  This morning, with husband woke up, he noticed that he was unable to wake up the patient.  EMS was called.  When rescue arrived, they started to bag her.  EMS provided Narcan which reportedly have some improvement in mental status.  Husband reports that she had not been ill recently.  No shortness of breath, cough or fever.  On arrival to the emergency room, she was noted to be somnolent, pinpoint pupils.  She received several more doses of Narcan with temporary improvement.  She was started on Narcan infusion referred for admission.  Review of Systems: As per HPI otherwise 10 point review of systems negative.    Past Medical History:  Diagnosis Date  . Arthritis   . breast ca dx'd 1995   right lumpectomy w/ xrt  . Breast cancer ([place]) 1994   right breast  . Female bladder prolapse   . Heart murmur   . Hyperlipidemia   . Kidney stones   . Migraines   . Osteoarthritis of both knees   . Psoriatic arthritis [facility])     Past Surgical History:  Procedure Laterality Date  . BREAST CYST EXCISION Right 1989  . BREAST LUMPECTOMY Right 1994  . CATARACT EXTRACTION    . TONSILLECTOMY    . TOTAL KNEE ARTHROPLASTY    . TYMPANOSTOMY TUBE PLACEMENT      Social  History:  reports that she has never smoked. She has never used smokeless tobacco. She reports current alcohol use. She reports that she does not use drugs.  Allergies  Allergen Reactions  . Morphine And Related   . Plaquenil [Hydroxychloroquine Sulfate]   . Trazodone Other (See Comments)    Felt very weird    Family history: Family history reviewed and not pertinent  Prior to Admission medications   Medication Sig Start Date End Date Taking? Authorizing Provider  aspirin (ECOTRIN LOW STRENGTH) 81 MG EC tablet Take 1 tablet by mouth daily. 04/01/07  Yes [provider]  busPIRone (BUSPAR) 7.5 MG tablet Take 1 tablet by mouth in the morning and at bedtime. 11/16/14  Yes [provider]  diazepam (VALIUM) 2 MG tablet Take 1 tablet by mouth every 12 (twelve) hours as needed. 06/23/19 07/23/19 Yes [provider]  gabapentin (NEURONTIN) 300 MG capsule Take 1 capsule by mouth daily. 01/28/19  Yes [provider]  hydrochlorothiazide (HYDRODIURIL) 25 MG tablet Take 1 tablet by mouth daily. 01/05/15  Yes [provider]  calcium citrate (CALCITRATE - DOSED IN MG ELEMENTAL CALCIUM) 950 (200 Ca) MG tablet Take 1 tablet by mouth daily.    [provider]  clobetasol (TEMOVATE) 0.05 % external solution Apply 1 application topically 2 (two) times daily. 01/02/13   Kennyth Arnold, FNP  folic acid (FOLVITE) 1 MG tablet Take  1 tablet by mouth daily.    [provider]  lovastatin (MEVACOR) 40 MG tablet Take 1 tablet (40 mg total) by mouth at bedtime. 01/02/13   Dutch Quint B, FNP  methotrexate (RHEUMATREX) 2.5 MG tablet Take 10 mg by mouth once a week. Caution:Chemotherapy. Protect from light.    [provider]  oxyCODONE (OXY IR/ROXICODONE) 5 MG immediate release tablet Take 1-2 tablets by mouth every 4 (four) hours as needed. 06/23/19   [provider]  predniSONE (DELTASONE) 5 MG tablet Take 5 mg by mouth daily. 04/09/19    [provider]  sertraline (ZOLOFT) 100 MG tablet Take 100 mg by mouth daily. 03/24/19   [provider]    Physical Exam: Vitals:   07/23/2019 1500 07/12/2019 1600 07/03/2019 1642 07/14/2019 1700  BP: 117/61 (!) 115/57  120/66  Pulse: 86 88 98 92  Resp: (!) 25 (!) 29 (!) 28 (!) 37  Temp:   (!) 101.8 F (38.8 C)   TempSrc:   Rectal   SpO2: 100% 91% 94% 99%  Weight:      Height:        Constitutional: NAD, calm, comfortable Eyes: PERRL, lids and conjunctivae normal ENMT: Mucous membranes are moist. Posterior pharynx clear of any exudate or lesions.Normal dentition.  Neck: normal, supple, no masses, no thyromegaly Respiratory: clear to auscultation bilaterally, no wheezing, no crackles. Normal respiratory effort. No accessory muscle use.  Cardiovascular: Regular rate and rhythm, no murmurs / rubs / gallops. No extremity edema. 2+ pedal pulses. No carotid bruits.  Abdomen: no tenderness, no masses palpated. No hepatosplenomegaly. Bowel sounds positive.  Musculoskeletal: no clubbing / cyanosis. No joint deformity upper and lower extremities. Good ROM, no contractures. Normal muscle tone.  Skin: no rashes, lesions, ulcers. No induration Neurologic: Limited exam due to mental status.  No gross deficits.  Briefly opens eyes to voice.  Withdraws all extremities to pain. Psychiatric: cannot assess due to mental status    Labs on Admission: I have personally reviewed following labs and imaging studies  CBC: Recent Labs  Lab 07/02/2019 1021  WBC 10.0  HGB 13.2  HCT 45.4  MCV 91.9  PLT [id number]   Basic Metabolic Panel: Recent Labs  Lab 07/14/2019 1021  NA 139  K 3.7  CL 103  CO2 24  GLUCOSE 120*  BUN 17  CREATININE 0.79  CALCIUM 9.3   GFR: Estimated Creatinine Clearance: 50.9 mL/min (by C-G formula based on SCr of 0.79 mg/dL). Liver Function Tests: Recent Labs  Lab 07/20/2019 1021  AST 27  ALT 18  ALKPHOS 81  BILITOT 0.4  PROT 8.1  ALBUMIN 4.1   No results for  input(s): LIPASE, AMYLASE in the last 168 hours. No results for input(s): AMMONIA in the last 168 hours. Coagulation Profile: No results for input(s): INR, PROTIME in the last 168 hours. Cardiac Enzymes: Recent Labs  Lab 06/25/2019 1021  CKTOTAL 46   BNP (last 3 results) No results for input(s): PROBNP in the last 8760 hours. HbA1C: No results for input(s): HGBA1C in the last 72 hours. CBG: No results for input(s): GLUCAP in the last 168 hours. Lipid Profile: No results for input(s): CHOL, HDL, LDLCALC, TRIG, CHOLHDL, LDLDIRECT in the last 72 hours. Thyroid Function Tests: No results for input(s): TSH, T4TOTAL, FREET4, T3FREE, THYROIDAB in the last 72 hours. Anemia Panel: No results for input(s): VITAMINB12, FOLATE, FERRITIN, TIBC, IRON, RETICCTPCT in the last 72 hours. Urine analysis:    Component Value Date/Time  COLORURINE YELLOW 07/19/2019 [place] 07/05/2019 0954   LABSPEC 1.009 07/17/2019 0954   PHURINE 5.0 06/28/2019 [place] 07/04/2019 0954   HGBUR NEGATIVE 07/11/2019 [place] 07/24/2019 [place] 06/29/2019 0954   PROTEINUR NEGATIVE 06/29/2019 0954   NITRITE NEGATIVE 07/13/2019 0954   LEUKOCYTESUR NEGATIVE 07/07/2019 0954    Radiological Exams on Admission: DG Chest 1 View  Result Date: 06/29/2019 CLINICAL DATA:  Found unresponsive EXAM: CHEST  1 VIEW COMPARISON:  None. FINDINGS: Right chest wall port with catheter tip overlying the superior right atrium. Patchy bibasilar atelectasis. No significant pleural effusion. No pneumothorax. Cardiomediastinal contours are within limits. Surgical clips overlie the right chest wall. IMPRESSION: Patchy bibasilar atelectasis. Electronically Signed   By: Macy Mis M.D.   On: 07/20/2019 10:57   CT HEAD WO CONTRAST  Result Date: 07/04/2019 CLINICAL DATA:  Found unresponsive EXAM: CT HEAD WITHOUT CONTRAST TECHNIQUE: Contiguous axial images were obtained from  the base of the skull through the vertex without intravenous contrast. COMPARISON:  None. FINDINGS: Brain: Chronic atrophic changes are noted. No findings to suggest acute hemorrhage or acute infarction are seen. 10 mm calcified meningioma is noted in the posterior fossa on the left. Vascular: No hyperdense vessel or unexpected calcification. Skull: Normal. Negative for fracture or focal lesion. Sinuses/Orbits: No acute finding. Other: None. IMPRESSION: Chronic atrophic changes. Left posterior fossa meningioma. No acute abnormality noted. Electronically Signed   By: Inez Catalina M.D.   On: 06/29/2019 10:39    EKG: Independently reviewed. Sinus rhythm without acute changes  Assessment/Plan Active Problems:   Pure hypercholesterolemia   Psoriatic arthritis (HCC)   Opiate overdose (HCC)   Acute encephalopathy   Colon cancer (HCC)   Chronic pain   Anxiety   Fever   Acute urinary retention     1. Acute encephalopathy secondary to opioid overdose.  Suspect this is unintentional.  Husband reports that she may have had some confusion about her medications.  CT head was unremarkable.  Urinalysis did not show any signs of infection.  She reportedly had some improvement with Narcan.  Currently on Narcan infusion.  Continue to monitor mental status.  Keep patient n.p.o. for now until mental status has improved. 2. Anxiety.  She is intermittently takes diazepam, but has been reports very small dosing. 3. Chronic pain syndrome secondary to underlying malignancy.  She is chronically on oxycodone. 4. Fever.  Shortly after admission, patient was noted to have a fever upon arrival to room.  She does not have any evidence of pneumonia (shortness of breath, cough).  Urinalysis no signs of infection.  Will send cultures.  Have low threshold to start antibiotics. 5. Elevated lactic acid.  Possibly related to dehydration.  She does not appear septic or toxic at this time.  Continue IV fluids and repeat lactic  acid. 6. Acute urinary retention.  Patient did reportedly take possible Tylenol PM as a sleeping aid overnight.  May have residual anticholinergic effects from antihistamines.  She underwent in and out catheter.  Continue to monitor urine output. 7. Psoriatic arthritis.  Chronically on methotrexate and prednisone.  These were on hold while she is n.p.o. 8. Colon cancer.  Followed by oncology at [facility].  She is no longer taking any treatment.  Her plan was to establish care with [facility].  It may be reasonable to arrange this prior to her discharge.  DVT prophylaxis: lovenox  Code Status:  DNR  Family Communication: discussed with husband over the phone  Disposition Plan: discharge home once mental status has returned to baseline  Consults called:   Admission status: observation, stepdown   Kathie Dike MD [facility]   If 7PM-7AM, please contact night-coverage [email or web address]   07/08/2019, 5:47 PM   Addendum 18:45:  Informed by staff the patient is having persistent fevers.  Lactic acid elevated 2.8.  Continue hydration and will give fluid bolus of 1 L.  Blood pressure is currently stable.  Start on broad-spectrum intravenous antibiotics and check blood cultures.  [employer]

## 2019-07-01 ENCOUNTER — Inpatient Hospital Stay (HOSPITAL_COMMUNITY): Payer: Medicare HMO

## 2019-07-01 DIAGNOSIS — M1990 Unspecified osteoarthritis, unspecified site: Secondary | ICD-10-CM | POA: Diagnosis present

## 2019-07-01 DIAGNOSIS — T402X1A Poisoning by other opioids, accidental (unintentional), initial encounter: Secondary | ICD-10-CM | POA: Diagnosis present

## 2019-07-01 DIAGNOSIS — L4050 Arthropathic psoriasis, unspecified: Secondary | ICD-10-CM | POA: Diagnosis present

## 2019-07-01 DIAGNOSIS — T40601S Poisoning by unspecified narcotics, accidental (unintentional), sequela: Secondary | ICD-10-CM

## 2019-07-01 DIAGNOSIS — J9600 Acute respiratory failure, unspecified whether with hypoxia or hypercapnia: Secondary | ICD-10-CM | POA: Diagnosis not present

## 2019-07-01 DIAGNOSIS — G9341 Metabolic encephalopathy: Secondary | ICD-10-CM | POA: Diagnosis not present

## 2019-07-01 DIAGNOSIS — E785 Hyperlipidemia, unspecified: Secondary | ICD-10-CM | POA: Diagnosis present

## 2019-07-01 DIAGNOSIS — E876 Hypokalemia: Secondary | ICD-10-CM | POA: Diagnosis not present

## 2019-07-01 DIAGNOSIS — Z885 Allergy status to narcotic agent status: Secondary | ICD-10-CM | POA: Diagnosis not present

## 2019-07-01 DIAGNOSIS — C189 Malignant neoplasm of colon, unspecified: Secondary | ICD-10-CM | POA: Diagnosis present

## 2019-07-01 DIAGNOSIS — T402X5A Adverse effect of other opioids, initial encounter: Secondary | ICD-10-CM | POA: Diagnosis present

## 2019-07-01 DIAGNOSIS — Z96659 Presence of unspecified artificial knee joint: Secondary | ICD-10-CM | POA: Diagnosis present

## 2019-07-01 DIAGNOSIS — Z515 Encounter for palliative care: Secondary | ICD-10-CM | POA: Diagnosis not present

## 2019-07-01 DIAGNOSIS — A419 Sepsis, unspecified organism: Secondary | ICD-10-CM

## 2019-07-01 DIAGNOSIS — J9601 Acute respiratory failure with hypoxia: Secondary | ICD-10-CM | POA: Diagnosis not present

## 2019-07-01 DIAGNOSIS — Z20822 Contact with and (suspected) exposure to covid-19: Secondary | ICD-10-CM | POA: Diagnosis present

## 2019-07-01 DIAGNOSIS — J189 Pneumonia, unspecified organism: Secondary | ICD-10-CM | POA: Diagnosis present

## 2019-07-01 DIAGNOSIS — Z66 Do not resuscitate: Secondary | ICD-10-CM | POA: Diagnosis present

## 2019-07-01 DIAGNOSIS — C799 Secondary malignant neoplasm of unspecified site: Secondary | ICD-10-CM | POA: Diagnosis present

## 2019-07-01 DIAGNOSIS — R338 Other retention of urine: Secondary | ICD-10-CM

## 2019-07-01 DIAGNOSIS — G43909 Migraine, unspecified, not intractable, without status migrainosus: Secondary | ICD-10-CM | POA: Diagnosis present

## 2019-07-01 DIAGNOSIS — G894 Chronic pain syndrome: Secondary | ICD-10-CM | POA: Diagnosis present

## 2019-07-01 DIAGNOSIS — G92 Toxic encephalopathy: Secondary | ICD-10-CM | POA: Diagnosis present

## 2019-07-01 DIAGNOSIS — F419 Anxiety disorder, unspecified: Secondary | ICD-10-CM | POA: Diagnosis present

## 2019-07-01 DIAGNOSIS — Z7189 Other specified counseling: Secondary | ICD-10-CM | POA: Diagnosis not present

## 2019-07-01 DIAGNOSIS — R4182 Altered mental status, unspecified: Secondary | ICD-10-CM | POA: Diagnosis not present

## 2019-07-01 DIAGNOSIS — R0602 Shortness of breath: Secondary | ICD-10-CM | POA: Diagnosis present

## 2019-07-01 DIAGNOSIS — T402X4A Poisoning by other opioids, undetermined, initial encounter: Secondary | ICD-10-CM | POA: Diagnosis not present

## 2019-07-01 DIAGNOSIS — Z853 Personal history of malignant neoplasm of breast: Secondary | ICD-10-CM | POA: Diagnosis not present

## 2019-07-01 DIAGNOSIS — Z7952 Long term (current) use of systemic steroids: Secondary | ICD-10-CM | POA: Diagnosis not present

## 2019-07-01 DIAGNOSIS — R339 Retention of urine, unspecified: Secondary | ICD-10-CM | POA: Diagnosis present

## 2019-07-01 DIAGNOSIS — G9340 Encephalopathy, unspecified: Secondary | ICD-10-CM | POA: Diagnosis not present

## 2019-07-01 DIAGNOSIS — G893 Neoplasm related pain (acute) (chronic): Secondary | ICD-10-CM | POA: Diagnosis present

## 2019-07-01 DIAGNOSIS — E7800 Pure hypercholesterolemia, unspecified: Secondary | ICD-10-CM | POA: Diagnosis present

## 2019-07-01 LAB — MRSA PCR SCREENING: MRSA by PCR: NEGATIVE

## 2019-07-01 LAB — BASIC METABOLIC PANEL
Anion gap: 7 (ref 5–15)
BUN: 14 mg/dL (ref 8–23)
CO2: 26 mmol/L (ref 22–32)
Calcium: 8.4 mg/dL — ABNORMAL LOW (ref 8.9–10.3)
Chloride: 109 mmol/L (ref 98–111)
Creatinine, Ser: 0.76 mg/dL (ref 0.44–1.00)
GFR calc Af Amer: >60 mL/min (ref >60)
GFR calc non Af Amer: >60 mL/min (ref >60)
Glucose, Bld: 94 mg/dL (ref 70–99)
Potassium: 3.7 mmol/L (ref 3.5–5.1)
Sodium: 142 mmol/L (ref 135–145)

## 2019-07-01 LAB — CBC
HCT: 34.8 % — ABNORMAL LOW (ref 36.0–46.0)
Hemoglobin: 10.4 g/dL — ABNORMAL LOW (ref 12.0–15.0)
MCH: 26.9 pg (ref 26.0–34.0)
MCHC: 29.9 g/dL — ABNORMAL LOW (ref 30.0–36.0)
MCV: 89.9 fL (ref 80.0–100.0)
Platelets: 141 10*3/uL — ABNORMAL LOW (ref 150–400)
RBC: 3.87 MIL/uL (ref 3.87–5.11)
RDW: 15 % (ref 11.5–15.5)
WBC: 10.9 10*3/uL — ABNORMAL HIGH (ref 4.0–10.5)
nRBC: 0 % (ref 0.0–0.2)

## 2019-07-01 LAB — AMMONIA: Ammonia: 13 umol/L (ref 9–35)

## 2019-07-01 LAB — PROCALCITONIN: Procalcitonin: 0.1 ng/mL

## 2019-07-01 MED ORDER — PIPERACILLIN-TAZOBACTAM 3.375 G IVPB
3.3750 g | Freq: Three times a day (TID) | INTRAVENOUS | Status: DC
  Administered 2019-07-01 – 2019-07-03 (×7): 3.375 g via INTRAVENOUS
  Filled 2019-07-01 (×7): qty 50

## 2019-07-01 NOTE — Progress Notes (Signed)
PROGRESS NOTE  Chelsea Mayo [id number] DOB: January 06, 1941 DOA: 07/04/2019 PCP: Kennyth Arnold, FNP  Brief History:  79 year old female with a history of stage IIIb colon cancer, hyperlipidemia, anxiety, chronic pain related to malignancy, psoriatic arthritis presenting with altered mental status. Patient's husband reports that 06/29/19 evening, she was in her usual state of health.  She did complain of some nausea.  It is unclear whether she took Zofran or accidentally took extra oxycodone for symptoms.  On the morning of admission, with husband woke up, he noticed that he was unable to wake up the patient.  EMS was called.  When rescue arrived, they started to bag her.  EMS provided Narcan which reportedly have some improvement in mental status.  Husband reports that she had not been ill recently.  No shortness of breath, cough or fever.  On arrival to the emergency room, she was noted to be somnolent, pinpoint pupils.  She received several more doses of Narcan with temporary improvement.  She was started on Narcan infusion referred for admission.  Assessment/Plan: Acute toxic/metabolic encephalopathy -Likely secondary to hypnotic/opioid medications in addition to infectious process -Discontinue opioids and hypnotic medications for now and monitor clinically -Patient remains somnolent but arousable; does not follow commands -Avoid hypnotic and opioid medications unless absolutely necessary -Unfortunately, patient received a dose of Ativan on 07/15/2019 and 2345 -check ammonia  Fever -Continue vancomycin pending culture data -Discontinue cefepime as this may also contribute to the patient's encephalopathy -Start Zosyn empirically--concern about aspiration pneumonitis -MRSA screen negative -Personally reviewed chest x-ray--bibasilar atelectasis -CT chest -UA negative for pyuria  Undifferentiated sepsis -Presented with fever 101.8 F, elevated lactic acid, and altered mental  status -Work-up in progress -Continue empiric vancomycin and Zosyn pending culture data -UA negative for pyuria -Continue IV fluids -Check procalcitonin  Chronic pain syndrome--malignancy related pain -Holding opioids presently secondary to encephalopathy  Stage IIIb colon cancer -Patient followed [facility] -Review of the medical record shows that the patient had opted to discontinue further treatment in July 2020  Acute urinary retention -Likely secondary to the patient's opioids and Tylenol PM  Psoriatic arthritis -Chronically on methotrexate and prednisone which are on hold presently  Anxiety -Holding diazepam secondary to altered mental status  Goals of Care -consult palliative medicine -currently DNR    Disposition Plan: Patient From: Home D/C Place: Home v residential hospice- 2-3  Days Barriers: Not Clinically Stable--remains encephalopathic  Family Communication:   No Family at bedside  Consultants:  palliative  Code Status:  DNR  DVT Prophylaxis:   [place] Lovenox   Procedures: As Listed in Progress Note Above  Antibiotics: Zosyn 4/7>>> vanco 4/6>>>      Subjective: Patient is somnolent but arousable to voice and tactile stimuli.  She is not following commands or answering questions.  No reports of vomiting, diarrhea, respiratory distress, uncontrolled pain.  Objective: Vitals:   07/01/19 0800 07/01/19 0808 07/01/19 0900 07/01/19 1000  BP: (!) 112/46  129/63 132/75  Pulse: 78  93 91  Resp: (!) 22  (!) 39 (!) 33  Temp:  98.2 F (36.8 C)    TempSrc:  Oral    SpO2: 97%  90% 100%  Weight:      Height:        Intake/Output Summary (Last 24 hours) at 07/01/2019 1317 Last data filed at 07/01/2019 0830 Gross per 24 hour  Intake 1924.82 ml  Output 2100 ml  Net -175.18 ml   Weight  change:  Exam:   General:  Pt is somnolent, does not follow commands appropriately, not in acute distress  HEENT: No icterus, No thrush, No neck mass,  [place]/AT  Cardiovascular: RRR, S1/S2, no rubs, no gallops  Respiratory: Bibasilar crackles but no wheezing.  Good air movement.  Abdomen: Soft/+BS, non tender, non distended, no guarding  Extremities: No edema, No lymphangitis, No petechiae, No rashes, no synovitis   Data Reviewed: I have personally reviewed following labs and imaging studies Basic Metabolic Panel: Recent Labs  Lab 07/13/2019 1021 07/01/19 0352  NA 139 142  K 3.7 3.7  CL 103 109  CO2 24 26  GLUCOSE 120* 94  BUN 17 14  CREATININE 0.79 0.76  CALCIUM 9.3 8.4*   Liver Function Tests: Recent Labs  Lab 07/08/2019 1021  AST 27  ALT 18  ALKPHOS 81  BILITOT 0.4  PROT 8.1  ALBUMIN 4.1   No results for input(s): LIPASE, AMYLASE in the last 168 hours. No results for input(s): AMMONIA in the last 168 hours. Coagulation Profile: No results for input(s): INR, PROTIME in the last 168 hours. CBC: Recent Labs  Lab 07/04/2019 1021 07/01/19 0352  WBC 10.0 10.9*  HGB 13.2 10.4*  HCT 45.4 34.8*  MCV 91.9 89.9  PLT 150 141*   Cardiac Enzymes: Recent Labs  Lab 07/13/2019 1021  CKTOTAL 46   BNP: Invalid input(s): POCBNP CBG: No results for input(s): GLUCAP in the last 168 hours. HbA1C: No results for input(s): HGBA1C in the last 72 hours. Urine analysis:    Component Value Date/Time   COLORURINE YELLOW 07/11/2019 [place] 07/12/2019 0954   LABSPEC 1.009 06/29/2019 0954   PHURINE 5.0 07/14/2019 0954   GLUCOSEU NEGATIVE 06/29/2019 0954   HGBUR NEGATIVE 06/26/2019 [place] 07/09/2019 [place] 07/05/2019 0954   PROTEINUR NEGATIVE 06/27/2019 0954   NITRITE NEGATIVE 07/23/2019 0954   LEUKOCYTESUR NEGATIVE 07/10/2019 0954   Sepsis Labs: @LABRCNTIP (procalcitonin:4,lacticidven:4) ) Recent Results (from the past 240 hour(s))  Respiratory Panel by RT PCR (Flu A&B, Covid) - Nasopharyngeal Swab     Status: None   Collection Time: 07/13/2019 11:28 AM   Specimen:  Nasopharyngeal Swab  Result Value Ref Range Status   SARS Coronavirus 2 by RT PCR NEGATIVE NEGATIVE Final    Comment: (NOTE) SARS-CoV-2 target nucleic acids are NOT DETECTED. The SARS-CoV-2 RNA is generally detectable in upper respiratoy specimens during the acute phase of infection. The lowest concentration of SARS-CoV-2 viral copies this assay can detect is 131 copies/mL. A negative result does not preclude SARS-Cov-2 infection and should not be used as the sole basis for treatment or other patient management decisions. A negative result may occur with  improper specimen collection/handling, submission of specimen other than nasopharyngeal swab, presence of viral mutation(s) within the areas targeted by this assay, and inadequate number of viral copies (<131 copies/mL). A negative result must be combined with clinical observations, patient history, and epidemiological information. The expected result is Negative. Fact Sheet for Patients:  PinkCheek.be Fact Sheet for Healthcare Providers:  GravelBags.it This test is not yet ap proved or cleared by the [place] FDA and  has been authorized for detection and/or diagnosis of SARS-CoV-2 by FDA under an Emergency Use Authorization (EUA). This EUA will remain  in effect (meaning this test can be used) for the duration of the COVID-19 declaration under Section 564(b)(1) of the Act, 21 U.S.C. section 360bbb-3(b)(1), unless the authorization is terminated or revoked  sooner.    Influenza A by PCR NEGATIVE NEGATIVE Final   Influenza B by PCR NEGATIVE NEGATIVE Final    Comment: (NOTE) The Xpert Xpress SARS-CoV-2/FLU/RSV assay is intended as an aid in  the diagnosis of influenza from Nasopharyngeal swab specimens and  should not be used as a sole basis for treatment. Nasal washings and  aspirates are unacceptable for Xpert Xpress SARS-CoV-2/FLU/RSV  testing. Fact Sheet for  Patients: PinkCheek.be Fact Sheet for Healthcare Providers: GravelBags.it This test is not yet approved or cleared by the [place] FDA and  has been authorized for detection and/or diagnosis of SARS-CoV-2 by  FDA under an Emergency Use Authorization (EUA). This EUA will remain  in effect (meaning this test can be used) for the duration of the  Covid-19 declaration under Section 564(b)(1) of the Act, 21  U.S.C. section 360bbb-3(b)(1), unless the authorization is  terminated or revoked. Performed at [facility], [address] [place], [place] [address]   MRSA PCR Screening     Status: None   Collection Time: 06/27/2019 12:45 PM   Specimen: Nasal Mucosa; Nasopharyngeal  Result Value Ref Range Status   MRSA by PCR NEGATIVE NEGATIVE Final    Comment:        The GeneXpert MRSA Assay (FDA approved for NASAL specimens only), is one component of a comprehensive MRSA colonization surveillance program. It is not intended to diagnose MRSA infection nor to guide or monitor treatment for MRSA infections. Performed at [facility], [address] [place], [place] [address]   Culture, blood (routine x 2)     Status: None (Preliminary result)   Collection Time: 07/04/2019  6:14 PM   Specimen: BLOOD RIGHT HAND  Result Value Ref Range Status   Specimen Description BLOOD RIGHT HAND  Final   Special Requests   Final    BOTTLES DRAWN AEROBIC AND ANAEROBIC Blood Culture adequate volume   Culture   Final    NO GROWTH < 24 HOURS Performed at [facility], [address], [place], [place] [address]    Report Status PENDING  Incomplete  Culture, blood (routine x 2)     Status: None (Preliminary result)   Collection Time: 06/25/2019  6:21 PM   Specimen: BLOOD LEFT HAND  Result Value Ref Range Status   Specimen Description BLOOD LEFT HAND  Final   Special Requests   Final    BOTTLES DRAWN AEROBIC ONLY Blood Culture adequate volume   Culture    Final    NO GROWTH < 24 HOURS Performed at [facility], [address] [place], [place] [address]    Report Status PENDING  Incomplete     Scheduled Meds:  Chlorhexidine Gluconate Cloth  6 each Topical Daily   enoxaparin (LOVENOX) injection  40 mg Subcutaneous Q24H   Continuous Infusions:  lactated ringers 100 mL/hr at 07/01/19 1201   piperacillin-tazobactam (ZOSYN)  IV     vancomycin Stopped (06/29/2019 2120)    Procedures/Studies: DG Chest 1 View  Result Date: 07/24/2019 CLINICAL DATA:  Found unresponsive EXAM: CHEST  1 VIEW COMPARISON:  None. FINDINGS: Right chest wall port with catheter tip overlying the superior right atrium. Patchy bibasilar atelectasis. No significant pleural effusion. No pneumothorax. Cardiomediastinal contours are within limits. Surgical clips overlie the right chest wall. IMPRESSION: Patchy bibasilar atelectasis. Electronically Signed   By: Macy Mis M.D.   On: 07/15/2019 10:57   CT HEAD WO CONTRAST  Result Date: 07/19/2019 CLINICAL DATA:  Found unresponsive EXAM: CT HEAD  WITHOUT CONTRAST TECHNIQUE: Contiguous axial images were obtained from the base of the skull through the vertex without intravenous contrast. COMPARISON:  None. FINDINGS: Brain: Chronic atrophic changes are noted. No findings to suggest acute hemorrhage or acute infarction are seen. 10 mm calcified meningioma is noted in the posterior fossa on the left. Vascular: No hyperdense vessel or unexpected calcification. Skull: Normal. Negative for fracture or focal lesion. Sinuses/Orbits: No acute finding. Other: None. IMPRESSION: Chronic atrophic changes. Left posterior fossa meningioma. No acute abnormality noted. Electronically Signed   By: Inez Catalina M.D.   On: 07/22/2019 10:39    Orson Eva, DO  [facility]  If 7PM-7AM, please contact night-coverage [email or web address] Password [email or web address] 07/01/2019, 1:17 PM   LOS: 0 days

## 2019-07-01 NOTE — Progress Notes (Signed)
Pt has still not made any urine since In and Out cath performed this morning. Bladder scan performed again, showing > 243 mL urine. It has been 24 hours of performing in and out caths. Dr. Carles Collet notified. Verbal order given to place foley catheter. 14 Fr foley catheter placed with assist by Lillie Fragmin, RN. 400 mL urine output immediately after placement of foley catheter.

## 2019-07-01 NOTE — Progress Notes (Signed)
Has been 8 hours since last In and out cath. Patient still has not made any urine. Bladder scan performed and showing >250 mL urine. In and out performed per protocol, only 300 mL urine output collected.

## 2019-07-02 ENCOUNTER — Inpatient Hospital Stay (HOSPITAL_COMMUNITY)
Admit: 2019-07-02 | Discharge: 2019-07-02 | Disposition: A | Discharge status: Home / Self Care | Payer: Medicare HMO | Attending: Internal Medicine | Admitting: Internal Medicine

## 2019-07-02 ENCOUNTER — Inpatient Hospital Stay (HOSPITAL_COMMUNITY): Payer: Medicare HMO

## 2019-07-02 DIAGNOSIS — Z66 Do not resuscitate: Secondary | ICD-10-CM | POA: Diagnosis present

## 2019-07-02 DIAGNOSIS — Z7189 Other specified counseling: Secondary | ICD-10-CM

## 2019-07-02 DIAGNOSIS — Z515 Encounter for palliative care: Secondary | ICD-10-CM

## 2019-07-02 DIAGNOSIS — C189 Malignant neoplasm of colon, unspecified: Secondary | ICD-10-CM

## 2019-07-02 DIAGNOSIS — G9340 Encephalopathy, unspecified: Secondary | ICD-10-CM

## 2019-07-02 LAB — BLOOD GAS, ARTERIAL
Acid-base deficit: 0.1 mmol/L (ref 0.0–2.0)
Bicarbonate: 24.7 mmol/L (ref 20.0–28.0)
FIO2: 21
O2 Saturation: 92.2 %
Patient temperature: 37
pCO2 arterial: 32.1 mmHg (ref 32.0–48.0)
pH, Arterial: 7.472 — ABNORMAL HIGH (ref 7.350–7.450)
pO2, Arterial: 60.7 mmHg — ABNORMAL LOW (ref 83.0–108.0)

## 2019-07-02 LAB — GLUCOSE, CAPILLARY
Glucose-Capillary: 82 mg/dL (ref 70–99)
Glucose-Capillary: 86 mg/dL (ref 70–99)
Glucose-Capillary: 82 mg/dL (ref 70–99)

## 2019-07-02 LAB — COMPREHENSIVE METABOLIC PANEL
ALT: 19 U/L (ref 0–44)
AST: 30 U/L (ref 15–41)
Albumin: 2.8 g/dL — ABNORMAL LOW (ref 3.5–5.0)
Alkaline Phosphatase: 62 U/L (ref 38–126)
Anion gap: 9 (ref 5–15)
BUN: 10 mg/dL (ref 8–23)
CO2: 23 mmol/L (ref 22–32)
Calcium: 8.7 mg/dL — ABNORMAL LOW (ref 8.9–10.3)
Chloride: 109 mmol/L (ref 98–111)
Creatinine, Ser: 0.75 mg/dL (ref 0.44–1.00)
GFR calc Af Amer: >60 mL/min (ref >60)
GFR calc non Af Amer: >60 mL/min (ref >60)
Glucose, Bld: 91 mg/dL (ref 70–99)
Potassium: 3.4 mmol/L — ABNORMAL LOW (ref 3.5–5.1)
Sodium: 141 mmol/L (ref 135–145)
Total Bilirubin: 0.9 mg/dL (ref 0.3–1.2)
Total Protein: 5.8 g/dL — ABNORMAL LOW (ref 6.5–8.1)

## 2019-07-02 LAB — CBC
HCT: 33.8 % — ABNORMAL LOW (ref 36.0–46.0)
Hemoglobin: 10.3 g/dL — ABNORMAL LOW (ref 12.0–15.0)
MCH: 27.5 pg (ref 26.0–34.0)
MCHC: 30.5 g/dL (ref 30.0–36.0)
MCV: 90.1 fL (ref 80.0–100.0)
Platelets: 152 10*3/uL (ref 150–400)
RBC: 3.75 MIL/uL — ABNORMAL LOW (ref 3.87–5.11)
RDW: 15.2 % (ref 11.5–15.5)
WBC: 7.8 10*3/uL (ref 4.0–10.5)
nRBC: 0 % (ref 0.0–0.2)

## 2019-07-02 LAB — FOLATE: Folate: 30.7 ng/mL (ref >5.9)

## 2019-07-02 LAB — LACTIC ACID, PLASMA: Lactic Acid, Venous: 1.1 mmol/L (ref 0.5–1.9)

## 2019-07-02 LAB — VITAMIN B12: Vitamin B-12: 415 pg/mL (ref 180–914)

## 2019-07-02 MED ORDER — NALOXONE HCL 0.4 MG/ML IJ SOLN
0.4000 mg | Freq: Once | INTRAMUSCULAR | Status: AC
  Administered 2019-07-02: 0.4 mg via INTRAVENOUS
  Filled 2019-07-02: qty 1

## 2019-07-02 MED ORDER — POTASSIUM CHLORIDE 10 MEQ/100ML IV SOLN
10.0000 meq | INTRAVENOUS | Status: AC
  Administered 2019-07-02 (×2): 10 meq via INTRAVENOUS
  Filled 2019-07-02: qty 100

## 2019-07-02 NOTE — Progress Notes (Signed)
EEG completed, results pending. 

## 2019-07-02 NOTE — Consult Note (Signed)
Consultation Note Date: 07/02/2019   Patient Name: Chelsea Mayo  DOB: 1941-02-02  MRN: [id number]  Age / Sex: 79 y.o., female  PCP: Kennyth Arnold, FNP Referring Physician: Orson Eva, MD  Reason for Consultation: Establishing goals of care  HPI/Patient Profile: 79 y.o. female  with past medical history of anxiety, psoriatic arthritis, Stage IIIb colon cancer- s/p chemotherapy received at [facility]- treatment stopped July 2020 after progression on treatment and poor quality of life- she did not enroll in Hospice at that time- admitted on 07/12/2019 with altered mental status. She was found down at home, unresponsive, given Narcan with limited response and started on narcan infusion. Further workup reveals sepsis related to pneumonia. Palliative medicine consulted for goals of care.   Clinical Assessment and Goals of Care: Chart reviewed- including imaging, labs and records from outside institutions via care everywhere.Per chart review- as recently as 3/30 she was ambulatory, independent with ADL's, and working outside intermittently depending on how she was feeling- but was having worsening fatigue. Her main GOC at that time was symptom management only. She had delayed enrolling with Hospice due to concerns about COVID pandemic- plan was to enroll in late April to allow for increased vaccination of the population. Evaluated patient- her daughter is at bedside.  Marytza has two daughters, a son, and is married.  Patient is arouseable- oriented to herself only- she is smiling, but not able to participate in goals of care discussion.  She has a MOST form at bedside indicating DNR status and limited medical interventions, no feeding tube.  She had MRI of brain today with findings of known meningioma.  Plan was initially made to have [place] meeting with patient's daughter and spouse- however, spouse did not arrive- and was  unreachable by phone.    Primary Decision Maker NEXT OF KIN- spouse    SUMMARY OF RECOMMENDATIONS -Continue current level of care -MOST reviewed at bedside- DNR in place- limited medical interventions -Plan for followup [place] meeting tomorrow at 1:30 with spouse and daughter -Recommend aspiration precautions and delirium protocol     Code Status/Advance Care Planning:  DNR  Palliative Prophylaxis:   Aspiration and Delirium Protocol   Prognosis:    Unable to determine  Discharge Planning: To Be Determined  Primary Diagnoses: Present on Admission: . Opiate overdose ([place]) . Acute encephalopathy . Colon cancer ([place]) . Chronic pain . Anxiety . Pure hypercholesterolemia . Psoriatic arthritis ([place]) . Fever . Acute urinary retention . Acute metabolic encephalopathy   I have reviewed the medical record, interviewed the patient and family, and examined the patient. The following aspects are pertinent.  Past Medical History:  Diagnosis Date  . Arthritis   . breast ca dx'd 1995   right lumpectomy w/ xrt  . Breast cancer ([place]) 1994   right breast  . Female bladder prolapse   . Heart murmur   . Hyperlipidemia   . Kidney stones   . Migraines   . Osteoarthritis of both knees   . Psoriatic arthritis ([place])  Social History   Socioeconomic History  . Marital status: Married    Spouse name: Not on file  . Number of children: Not on file  . Years of education: Not on file  . Highest education level: Not on file  Occupational History  . Not on file  Tobacco Use  . Smoking status: Never Smoker  . Smokeless tobacco: Never Used  Substance and Sexual Activity  . Alcohol use: Yes  . Drug use: No  . Sexual activity: Not on file  Other Topics Concern  . Not on file  Social History Narrative  . Not on file   Social Determinants of Health   Financial Resource Strain:   . Difficulty of Paying Living Expenses:   Food Insecurity:   . Worried About Sales executive in the Last Year:   . Arboriculturist in the Last Year:   Transportation Needs:   . Film/video editor (Medical):   Marland Kitchen Lack of Transportation (Non-Medical):   Physical Activity:   . Days of Exercise per Week:   . Minutes of Exercise per Session:   Stress:   . Feeling of Stress :   Social Connections:   . Frequency of Communication with Friends and Family:   . Frequency of Social Gatherings with Friends and Family:   . Attends Religious Services:   . Active Member of Clubs or Organizations:   . Attends Archivist Meetings:   Marland Kitchen Marital Status:    History reviewed. No pertinent family history. Scheduled Meds: . Chlorhexidine Gluconate Cloth  6 each Topical Daily  . enoxaparin (LOVENOX) injection  40 mg Subcutaneous Q24H   Continuous Infusions: . lactated ringers 100 mL/hr at 07/02/19 1539  . piperacillin-tazobactam (ZOSYN)  IV 12.5 mL/hr at 07/02/19 1539   PRN Meds:.acetaminophen **OR** acetaminophen, naloxone, ondansetron **OR** ondansetron (ZOFRAN) IV Medications Prior to Admission:  Prior to Admission medications   Medication Sig Start Date End Date Taking? Authorizing Provider  aspirin (ECOTRIN LOW STRENGTH) 81 MG EC tablet Take 1 tablet by mouth daily. 04/01/07  Yes [provider]  busPIRone (BUSPAR) 7.5 MG tablet Take 1 tablet by mouth in the morning and at bedtime. 11/16/14  Yes [provider]  calcium citrate (CALCITRATE - DOSED IN MG ELEMENTAL CALCIUM) 950 (200 Ca) MG tablet Take 1 tablet by mouth daily.   Yes [provider]  clobetasol (TEMOVATE) 0.05 % external solution Apply 1 application topically 2 (two) times daily. 01/02/13  Yes Dutch Quint B, FNP  diazepam (VALIUM) 2 MG tablet Take 1 tablet by mouth every 12 (twelve) hours as needed. 06/23/19 07/23/19 Yes [provider]  diphenhydrAMINE-APAP, sleep, (ACETAMINOPHEN PM PO) Take 1 tablet by mouth daily as needed (headache).   Yes [provider]  folic  acid (FOLVITE) 1 MG tablet Take 1 tablet by mouth daily.   Yes [provider]  gabapentin (NEURONTIN) 300 MG capsule Take 1 capsule by mouth daily as needed (for feet).  01/28/19  Yes [provider]  hydrochlorothiazide (HYDRODIURIL) 25 MG tablet Take 1 tablet by mouth daily. 01/05/15  Yes [provider]  ondansetron (ZOFRAN) 8 MG tablet Take 1 tablet by mouth every 8 (eight) hours as needed. 05/25/19  Yes [provider]  oxyCODONE (OXY IR/ROXICODONE) 5 MG immediate release tablet Take 1-2 tablets by mouth every 4 (four) hours as needed. 06/23/19  Yes [provider]  predniSONE (DELTASONE) 5 MG tablet Take 5 mg by mouth daily. 04/09/19  Yes [provider]  sertraline (ZOLOFT) 100 MG tablet Take 100 mg by mouth daily. 03/24/19  Yes [provider]  lovastatin (MEVACOR) 40 MG tablet Take 1 tablet (40 mg total) by mouth at bedtime. 01/02/13   Dutch Quint B, FNP  methotrexate (RHEUMATREX) 2.5 MG tablet Take 10 mg by mouth once a week. Caution:Chemotherapy. Protect from light.    [provider]   Allergies  Allergen Reactions  . Morphine And Related   . Plaquenil [Hydroxychloroquine Sulfate]   . Trazodone Other (See Comments)    Felt very weird   Review of Systems  Unable to perform ROS: Mental status change    Physical Exam Vitals and nursing note reviewed.  Constitutional:      Comments: Frail appearing   Cardiovascular:     Pulses: Normal pulses.  Pulmonary:     Comments: Increased RR Skin:    Coloration: Skin is pale.  Neurological:     Mental Status: She is disoriented.     Vital Signs: BP (!) 146/82   Pulse 94   Temp 98.6 F (37 C) (Oral)   Resp (!) 30   Ht 5\' 4"  (1.626 m)   Wt 67.6 kg   SpO2 96%   BMI 25.58 kg/m  Pain Scale: CPOT   Pain Score: Asleep   SpO2: SpO2: 96 % O2 Device:SpO2: 96 % O2 Flow Rate: .O2 Flow Rate (L/min): 0 L/min  IO: Intake/output summary:   Intake/Output  Summary (Last 24 hours) at 07/02/2019 1559 Last data filed at 07/02/2019 1539 Gross per 24 hour  Intake 2449.91 ml  Output 1250 ml  Net 1199.91 ml    LBM:   Baseline Weight: Weight: 66.2 kg Most recent weight: Weight: 67.6 kg     Palliative Assessment/Data: PPS: 10%     Thank you for this consult. Palliative medicine will continue to follow and assist as needed.   Time In: 1530 Time Out: 1615 Time Total: 45 minutes Greater than 50%  of this time was spent counseling and coordinating care related to the above assessment and plan.  Signed by: Mariana Kaufman, AGNP-C Palliative Medicine    Please contact Palliative Medicine Team phone at [phone] for questions and concerns.  For individual provider: See Shea Evans

## 2019-07-02 NOTE — Progress Notes (Signed)
Patient only responsive to voice during most of shift. Narcan 0.4 mg IV was given around 1312. Patient went to MRI around 1330 and was gone until around 1400. Patient started to arouse more and answer yes or no questions around 1500. Patient's daughter at bedside requested for patient to have a popsicle. This RN stated that patient is NPO and it would not be advisable due to patient not being completely alert with the worry of aspiration. A speech eval was ordered to determine if patient will be able to tolerate eating/drinking IF patient is arousable at the time. Daughter acknowledged understanding.  Patient currently in bed, EEG was just completed. Daughter and husband at bedside. Patient somnolent again. With eye opening responses only to verbal stimuli. Mouth swabs provided to daughter at bedside to only swab patient's mouth with small amounts of water.  Patient's husband has again asked about patient not eating and should we be giving her glucose. This RN educated that although patient isn't eating, that patient is getting fluids which can help with not eating or drinking, that patient is staying hydrated. This RN also educated that patient's blood sugar is being checked to make sure it is within normal limits; that if the blood sugar drops below 70, we have an order to give dextrose. Patient's husband verbalized understanding.

## 2019-07-02 NOTE — Progress Notes (Addendum)
PROGRESS NOTE  Chelsea Mayo [id number] DOB: 10-Jan-1941 DOA: 07/04/2019 PCP: Kennyth Arnold, FNP  Brief History:  79 year old female with a history of stage IIIb colon cancer, hyperlipidemia, anxiety, chronic pain related to malignancy, psoriatic arthritis presenting with altered mental status. Patient's husband reports that 06/29/19 evening, she was in her usual state of health. She did complain of some nausea. It is unclear whether she took Zofran or accidentally took extra oxycodone for symptoms. On the morning of admission, with husband woke up, he noticed that he was unable to wake up the patient. EMS was called. When rescue arrived, they started to bag her. EMS provided Narcan which reportedly have some improvement in mental status. Husband reports that she had not been ill recently. No shortness of breath, cough or fever. On arrival to the emergency room, she was noted to be somnolent, pinpoint pupils. She received several more doses of Narcan with temporary improvement. She was started on Narcan infusion referred for admission.  Assessment/Plan: Acute toxic/metabolic encephalopathy -Likely secondary to hypnotic/opioid medications in addition to infectious process -Discontinue opioids and hypnotic medications for now and monitor clinically -Patient remains somnolent but arousable; does not follow commands -Avoid hypnotic and opioid medications unless absolutely necessary -Unfortunately, patient received a dose of Ativan on 07/18/2019 and 2345 -07/02/19--pt remains somnolent, arouses only to protopathic stimule -check ammonia--13 -MRI brain -EEG -UA--neg for pyuria -ABG -repeat narcan and monitor response  Fever -Continue vancomycin pending culture data -Discontinue cefepime as this may also contribute to the patient's encephalopathy -Start Zosyn empirically--concern about aspiration pneumonitis -MRSA screen negative -Personally reviewed chest x-ray--bibasilar  atelectasis -CT chest--bilateral LL collapse/consolidation L>R, GGO of RUL -UA negative for pyuria  sepsis -Presented with fever 101.8 F, elevated lactic acid, and altered mental status -due to pnuemonia -Continue zosyn -d/c vanco -UA negative for pyuria -Continue IV fluids -Check procalcitonin--0.10  Pneumonia-lobar -as discussed above  Chronic pain syndrome--malignancy related pain -Holding opioids presently secondary to encephalopathy  Stage IIIb colon cancer -Patient followed [facility] -Review of the medical record shows that the patient had opted to discontinue further treatment in July 2020  Acute urinary retention -Likely secondary to the patient's opioids and Tylenol PM  Psoriatic arthritis -Chronically on methotrexate and prednisone which are on hold presently  Anxiety -Holding diazepam secondary to altered mental status  Hypokalemia -replete -check mag  Goals of Care -consult palliative medicine -currently DNR Advance care planning, including the explanation and discussion of advance directives was carried out with the patient and family.  Code status including explanations of "Full Code" and "DNR" and alternatives were discussed in detail.  Discussion of end-of-life issues including but not limited palliative care, hospice care and the concept of hospice, other end-of-life care options, power of attorney for health care decisions, living wills, and physician orders for life-sustaining treatment were also discussed with the patient and family.  Total face to face time 16 minutes.     Disposition Plan: Patient From: Home D/C Place: Home v residential hospice- 2-3  Days Barriers: Not Clinically Stable--remains encephalopathic  Family Communication:  spouse and daughter updated 4/8  Consultants:  palliative  Code Status:  DNR  DVT Prophylaxis:   [place] Lovenox   Procedures: As Listed in Progress Note Above  Antibiotics: Zosyn  4/7>>> vanco 4/6>>>4/8    Subjective: Pt remains somnolent.  Arouses only to protopathic stimuli.  No respiratory distress.  No vomiting or diarrhea.  Does not follow commands  Objective:  Vitals:   07/02/19 0800 07/02/19 0900 07/02/19 1000 07/02/19 1100  BP: 116/79 122/66 120/64 117/65  Pulse: 86 79 74 78  Resp: (!) 36 (!) 26 (!) 24 (!) 27  Temp:    98.4 F (36.9 C)  TempSrc:    Axillary  SpO2: 95% 96% 97% 95%  Weight:      Height:        Intake/Output Summary (Last 24 hours) at 07/02/2019 1211 Last data filed at 07/02/2019 0900 Gross per 24 hour  Intake 1873.54 ml  Output 1250 ml  Net 623.54 ml   Weight change: 1.4 kg Exam:   General:  Pt is somnolent, does not follow commands appropriately, not in acute distress  HEENT: No icterus, No thrush, No neck mass, [place]/AT  Cardiovascular: RRR, S1/S2, no rubs, no gallops  Respiratory: bibasilar crackles. No wheeze  Abdomen: Soft/+BS, non tender, non distended, no guarding  Extremities: No edema, No lymphangitis, No petechiae, No rashes, no synovitis   Data Reviewed: I have personally reviewed following labs and imaging studies Basic Metabolic Panel: Recent Labs  Lab 07/22/2019 1021 07/01/19 0352 07/02/19 0356  NA 139 142 141  K 3.7 3.7 3.4*  CL 103 109 109  CO2 24 26 23   GLUCOSE 120* 94 91  BUN 17 14 10   CREATININE 0.79 0.76 0.75  CALCIUM 9.3 8.4* 8.7*   Liver Function Tests: Recent Labs  Lab 06/29/2019 1021 07/02/19 0356  AST 27 30  ALT 18 19  ALKPHOS 81 62  BILITOT 0.4 0.9  PROT 8.1 5.8*  ALBUMIN 4.1 2.8*   No results for input(s): LIPASE, AMYLASE in the last 168 hours. Recent Labs  Lab 07/01/19 1403  AMMONIA 13   Coagulation Profile: No results for input(s): INR, PROTIME in the last 168 hours. CBC: Recent Labs  Lab 07/02/2019 1021 07/01/19 0352 07/02/19 0356  WBC 10.0 10.9* 7.8  HGB 13.2 10.4* 10.3*  HCT 45.4 34.8* 33.8*  MCV 91.9 89.9 90.1  PLT 150 141* 152   Cardiac Enzymes: Recent  Labs  Lab 06/25/2019 1021  CKTOTAL 46   BNP: Invalid input(s): POCBNP CBG: Recent Labs  Lab 07/02/19 1110  GLUCAP 82   HbA1C: No results for input(s): HGBA1C in the last 72 hours. Urine analysis:    Component Value Date/Time   COLORURINE YELLOW 07/03/2019 [place] 07/19/2019 0954   LABSPEC 1.009 07/02/2019 0954   PHURINE 5.0 07/24/2019 0954   GLUCOSEU NEGATIVE 07/17/2019 0954   HGBUR NEGATIVE 06/26/2019 [place] 07/08/2019 [place] 06/29/2019 0954   PROTEINUR NEGATIVE 07/11/2019 0954   NITRITE NEGATIVE 06/27/2019 0954   LEUKOCYTESUR NEGATIVE 07/07/2019 0954   Sepsis Labs: @LABRCNTIP (procalcitonin:4,lacticidven:4) ) Recent Results (from the past 240 hour(s))  Respiratory Panel by RT PCR (Flu A&B, Covid) - Nasopharyngeal Swab     Status: None   Collection Time: 07/07/2019 11:28 AM   Specimen: Nasopharyngeal Swab  Result Value Ref Range Status   SARS Coronavirus 2 by RT PCR NEGATIVE NEGATIVE Final    Comment: (NOTE) SARS-CoV-2 target nucleic acids are NOT DETECTED. The SARS-CoV-2 RNA is generally detectable in upper respiratoy specimens during the acute phase of infection. The lowest concentration of SARS-CoV-2 viral copies this assay can detect is 131 copies/mL. A negative result does not preclude SARS-Cov-2 infection and should not be used as the sole basis for treatment or other patient management decisions. A negative result may occur with  improper specimen collection/handling, submission of specimen other  than nasopharyngeal swab, presence of viral mutation(s) within the areas targeted by this assay, and inadequate number of viral copies (<131 copies/mL). A negative result must be combined with clinical observations, patient history, and epidemiological information. The expected result is Negative. Fact Sheet for Patients:  PinkCheek.be Fact Sheet for Healthcare Providers:   GravelBags.it This test is not yet ap proved or cleared by the [place] FDA and  has been authorized for detection and/or diagnosis of SARS-CoV-2 by FDA under an Emergency Use Authorization (EUA). This EUA will remain  in effect (meaning this test can be used) for the duration of the COVID-19 declaration under Section 564(b)(1) of the Act, 21 U.S.C. section 360bbb-3(b)(1), unless the authorization is terminated or revoked sooner.    Influenza A by PCR NEGATIVE NEGATIVE Final   Influenza B by PCR NEGATIVE NEGATIVE Final    Comment: (NOTE) The Xpert Xpress SARS-CoV-2/FLU/RSV assay is intended as an aid in  the diagnosis of influenza from Nasopharyngeal swab specimens and  should not be used as a sole basis for treatment. Nasal washings and  aspirates are unacceptable for Xpert Xpress SARS-CoV-2/FLU/RSV  testing. Fact Sheet for Patients: PinkCheek.be Fact Sheet for Healthcare Providers: GravelBags.it This test is not yet approved or cleared by the [place] FDA and  has been authorized for detection and/or diagnosis of SARS-CoV-2 by  FDA under an Emergency Use Authorization (EUA). This EUA will remain  in effect (meaning this test can be used) for the duration of the  Covid-19 declaration under Section 564(b)(1) of the Act, 21  U.S.C. section 360bbb-3(b)(1), unless the authorization is  terminated or revoked. Performed at [facility], [address] [place], [place] [address]   MRSA PCR Screening     Status: None   Collection Time: 07/12/2019 12:45 PM   Specimen: Nasal Mucosa; Nasopharyngeal  Result Value Ref Range Status   MRSA by PCR NEGATIVE NEGATIVE Final    Comment:        The GeneXpert MRSA Assay (FDA approved for NASAL specimens only), is one component of a comprehensive MRSA colonization surveillance program. It is not intended to diagnose MRSA infection nor to guide  or monitor treatment for MRSA infections. Performed at [facility], [address] [place], [place] [address]   Culture, blood (routine x 2)     Status: None (Preliminary result)   Collection Time: 06/27/2019  6:14 PM   Specimen: BLOOD RIGHT HAND  Result Value Ref Range Status   Specimen Description BLOOD RIGHT HAND  Final   Special Requests   Final    BOTTLES DRAWN AEROBIC AND ANAEROBIC Blood Culture adequate volume   Culture   Final    NO GROWTH 2 DAYS Performed at [facility], [address], [place], [place] [address]    Report Status PENDING  Incomplete  Culture, blood (routine x 2)     Status: None (Preliminary result)   Collection Time: 07/21/2019  6:21 PM   Specimen: BLOOD LEFT HAND  Result Value Ref Range Status   Specimen Description BLOOD LEFT HAND  Final   Special Requests   Final    BOTTLES DRAWN AEROBIC ONLY Blood Culture adequate volume   Culture   Final    NO GROWTH 2 DAYS Performed at [facility], [address], [place], [place] [address]    Report Status PENDING  Incomplete     Scheduled Meds: . Chlorhexidine Gluconate Cloth  6 each Topical Daily  . enoxaparin (LOVENOX) injection  40 mg Subcutaneous  Q24H   Continuous Infusions: . lactated ringers 100 mL/hr at 07/02/19 0758  . piperacillin-tazobactam (ZOSYN)  IV 12.5 mL/hr at 07/02/19 [id number]    Procedures/Studies: DG Chest 1 View  Result Date: 07/16/2019 CLINICAL DATA:  Found unresponsive EXAM: CHEST  1 VIEW COMPARISON:  None. FINDINGS: Right chest wall port with catheter tip overlying the superior right atrium. Patchy bibasilar atelectasis. No significant pleural effusion. No pneumothorax. Cardiomediastinal contours are within limits. Surgical clips overlie the right chest wall. IMPRESSION: Patchy bibasilar atelectasis. Electronically Signed   By: Macy Mis M.D.   On: 07/03/2019 10:57   CT HEAD WO CONTRAST  Result Date: 06/29/2019 CLINICAL DATA:  Found unresponsive EXAM: CT HEAD WITHOUT CONTRAST  TECHNIQUE: Contiguous axial images were obtained from the base of the skull through the vertex without intravenous contrast. COMPARISON:  None. FINDINGS: Brain: Chronic atrophic changes are noted. No findings to suggest acute hemorrhage or acute infarction are seen. 10 mm calcified meningioma is noted in the posterior fossa on the left. Vascular: No hyperdense vessel or unexpected calcification. Skull: Normal. Negative for fracture or focal lesion. Sinuses/Orbits: No acute finding. Other: None. IMPRESSION: Chronic atrophic changes. Left posterior fossa meningioma. No acute abnormality noted. Electronically Signed   By: Inez Catalina M.D.   On: 07/05/2019 10:39   CT CHEST WO CONTRAST  Result Date: 07/01/2019 CLINICAL DATA:  Cough with persistent dyspnea. EXAM: CT CHEST WITHOUT CONTRAST TECHNIQUE: Multidetector CT imaging of the chest was performed following the standard protocol without IV contrast. COMPARISON:  11/02/2014 FINDINGS: Cardiovascular: The heart size is normal. No substantial pericardial effusion. Coronary artery calcification is evident. Atherosclerotic calcification is noted in the wall of the thoracic aorta. Right Port-A-Cath tip is positioned in the upper right atrium. Mediastinum/Nodes: No mediastinal lymphadenopathy. No evidence for gross hilar lymphadenopathy although assessment is limited by the lack of intravenous contrast on today's study. The esophagus has normal imaging features. There is no axillary lymphadenopathy. Lungs/Pleura: Patchy areas of ground-glass attenuation are seen in the right upper lobe with bilateral lower lobe collapse/consolidation, left greater than right. Subpleural reticulation anterior right upper lobe may be related to radiation fibrosis. No substantial pleural effusion. Upper Abdomen: Soft tissue attenuation is identified between the stomach and the lateral segment left liver, potentially bowel but indeterminate on this noncontrast CT. Musculoskeletal: No worrisome  lytic or sclerotic osseous abnormality. IMPRESSION: 1. Bilateral lower lobe collapse/consolidation, left greater than right is associated with patchy areas of ground-glass attenuation in the right upper lung. Imaging features suggest multifocal pneumonia. 2. Soft tissue attenuation between the stomach and the lateral segment left liver, potentially bowel but somewhat atypical in appearance on coronal reconstructions. Dedicated abdomen/pelvis CT with oral and intravenous contrast (renal function permitting) recommended to further evaluate. 3. Aortic Atherosclerosis (ICD10-I70.0). Electronically Signed   By: Misty Stanley M.D.   On: 07/01/2019 14:44    Orson Eva, DO  [facility]  If 7PM-7AM, please contact night-coverage [email or web address] Password [email or web address] 07/02/2019, 12:11 PM   LOS: 1 day

## 2019-07-02 NOTE — Progress Notes (Signed)
Palliative-   Consult received and chart reviewed- plan to meet with patient's daughter and spouse at 3:30pm today.   Mariana Kaufman, AGNP-C Palliative Medicine  Please call Palliative Medicine team phone with any questions [phone]. For individual providers please see AMION.  No charge

## 2019-07-03 DIAGNOSIS — J9601 Acute respiratory failure with hypoxia: Secondary | ICD-10-CM

## 2019-07-03 DIAGNOSIS — T402X1A Poisoning by other opioids, accidental (unintentional), initial encounter: Secondary | ICD-10-CM

## 2019-07-03 DIAGNOSIS — G9341 Metabolic encephalopathy: Secondary | ICD-10-CM

## 2019-07-03 DIAGNOSIS — J188 Other pneumonia, unspecified organism: Secondary | ICD-10-CM

## 2019-07-03 DIAGNOSIS — Z515 Encounter for palliative care: Secondary | ICD-10-CM

## 2019-07-03 DIAGNOSIS — J9600 Acute respiratory failure, unspecified whether with hypoxia or hypercapnia: Secondary | ICD-10-CM

## 2019-07-03 DIAGNOSIS — J189 Pneumonia, unspecified organism: Secondary | ICD-10-CM

## 2019-07-03 DIAGNOSIS — T402X4A Poisoning by other opioids, undetermined, initial encounter: Secondary | ICD-10-CM

## 2019-07-03 DIAGNOSIS — R4182 Altered mental status, unspecified: Secondary | ICD-10-CM

## 2019-07-03 LAB — COMPREHENSIVE METABOLIC PANEL
ALT: 17 U/L (ref 0–44)
AST: 27 U/L (ref 15–41)
Albumin: 2.8 g/dL — ABNORMAL LOW (ref 3.5–5.0)
Alkaline Phosphatase: 61 U/L (ref 38–126)
Anion gap: 9 (ref 5–15)
BUN: 10 mg/dL (ref 8–23)
CO2: 23 mmol/L (ref 22–32)
Calcium: 8.4 mg/dL — ABNORMAL LOW (ref 8.9–10.3)
Chloride: 109 mmol/L (ref 98–111)
Creatinine, Ser: 0.73 mg/dL (ref 0.44–1.00)
GFR calc Af Amer: >60 mL/min (ref >60)
GFR calc non Af Amer: >60 mL/min (ref >60)
Glucose, Bld: 82 mg/dL (ref 70–99)
Potassium: 3.5 mmol/L (ref 3.5–5.1)
Sodium: 141 mmol/L (ref 135–145)
Total Bilirubin: 0.9 mg/dL (ref 0.3–1.2)
Total Protein: 5.8 g/dL — ABNORMAL LOW (ref 6.5–8.1)

## 2019-07-03 LAB — GLUCOSE, CAPILLARY
Glucose-Capillary: 72 mg/dL (ref 70–99)
Glucose-Capillary: 65 mg/dL — ABNORMAL LOW (ref 70–99)
Glucose-Capillary: 102 mg/dL — ABNORMAL HIGH (ref 70–99)
Glucose-Capillary: 84 mg/dL (ref 70–99)

## 2019-07-03 LAB — PROCALCITONIN: Procalcitonin: <0.1 ng/mL

## 2019-07-03 LAB — CBC
HCT: 33.9 % — ABNORMAL LOW (ref 36.0–46.0)
Hemoglobin: 10.4 g/dL — ABNORMAL LOW (ref 12.0–15.0)
MCH: 27.7 pg (ref 26.0–34.0)
MCHC: 30.7 g/dL (ref 30.0–36.0)
MCV: 90.2 fL (ref 80.0–100.0)
Platelets: 159 10*3/uL (ref 150–400)
RBC: 3.76 MIL/uL — ABNORMAL LOW (ref 3.87–5.11)
RDW: 15.3 % (ref 11.5–15.5)
WBC: 6.8 10*3/uL (ref 4.0–10.5)
nRBC: 0 % (ref 0.0–0.2)

## 2019-07-03 MED ORDER — HYDROMORPHONE HCL 1 MG/ML IJ SOLN
0.5000 mg | INTRAMUSCULAR | Status: DC
  Administered 2019-07-03 – 2019-07-04 (×5): 0.5 mg via INTRAVENOUS
  Filled 2019-07-03 (×5): qty 0.5

## 2019-07-03 MED ORDER — HYDROMORPHONE HCL 1 MG/ML IJ SOLN
0.5000 mg | INTRAMUSCULAR | Status: DC | PRN
  Administered 2019-07-04: 0.5 mg via INTRAVENOUS
  Filled 2019-07-03: qty 0.5

## 2019-07-03 MED ORDER — ONDANSETRON HCL 4 MG/2ML IJ SOLN: 4.0000 mg | Freq: Four times a day (QID) | INTRAMUSCULAR | Status: DC | PRN

## 2019-07-03 MED ORDER — GLYCOPYRROLATE 0.2 MG/ML IJ SOLN
0.2000 mg | INTRAMUSCULAR | Status: DC | PRN
  Filled 2019-07-03: qty 1

## 2019-07-03 MED ORDER — LORAZEPAM 1 MG PO TABS: 1.0000 mg | ORAL_TABLET | ORAL | Status: DC | PRN

## 2019-07-03 MED ORDER — GLYCOPYRROLATE 0.2 MG/ML IJ SOLN
0.2000 mg | INTRAMUSCULAR | Status: DC | PRN
  Administered 2019-07-05 – 2019-07-06 (×2): 0.2 mg via INTRAVENOUS
  Filled 2019-07-03: qty 1

## 2019-07-03 MED ORDER — GLYCERIN (LAXATIVE) 2.1 G RE SUPP
1.0000 | Freq: Every day | RECTAL | Status: DC | PRN
  Filled 2019-07-03: qty 1

## 2019-07-03 MED ORDER — POLYVINYL ALCOHOL 1.4 % OP SOLN: 1.0000 [drp] | Freq: Four times a day (QID) | OPHTHALMIC | Status: DC | PRN

## 2019-07-03 MED ORDER — LORAZEPAM 2 MG/ML IJ SOLN
1.0000 mg | INTRAMUSCULAR | Status: DC | PRN
  Administered 2019-07-05: 1 mg via INTRAVENOUS
  Filled 2019-07-03: qty 1

## 2019-07-03 MED ORDER — ONDANSETRON 4 MG PO TBDP: 4.0000 mg | ORAL_TABLET | Freq: Four times a day (QID) | ORAL | Status: DC | PRN

## 2019-07-03 MED ORDER — HALOPERIDOL LACTATE 2 MG/ML PO CONC
0.5000 mg | ORAL | Status: DC | PRN
  Filled 2019-07-03: qty 0.3

## 2019-07-03 MED ORDER — HALOPERIDOL 0.5 MG PO TABS: 0.5000 mg | ORAL_TABLET | ORAL | Status: DC | PRN

## 2019-07-03 MED ORDER — LORAZEPAM 2 MG/ML PO CONC: 1.0000 mg | ORAL | Status: DC | PRN

## 2019-07-03 MED ORDER — BISACODYL 10 MG RE SUPP: 10.0000 mg | Freq: Every day | RECTAL | Status: DC | PRN

## 2019-07-03 MED ORDER — BIOTENE DRY MOUTH MT LIQD: 15.0000 mL | OROMUCOSAL | Status: DC | PRN

## 2019-07-03 MED ORDER — KCL IN DEXTROSE-NACL 20-5-0.9 MEQ/L-%-% IV SOLN: INTRAVENOUS | Status: DC

## 2019-07-03 MED ORDER — GLYCOPYRROLATE 1 MG PO TABS: 1.0000 mg | ORAL_TABLET | ORAL | Status: DC | PRN

## 2019-07-03 MED ORDER — GLYCERIN (LAXATIVE) 2.1 G RE SUPP
1.0000 | Freq: Once | RECTAL | Status: AC
  Administered 2019-07-03: 15:00:00 1 via RECTAL
  Filled 2019-07-03: qty 1

## 2019-07-03 MED ORDER — HALOPERIDOL LACTATE 5 MG/ML IJ SOLN: 0.5000 mg | INTRAMUSCULAR | Status: DC | PRN

## 2019-07-03 NOTE — Progress Notes (Addendum)
PROGRESS NOTE  Kalaiah Darocha [id number] DOB: 1940/08/30 DOA: 07/11/2019 PCP: Kennyth Arnold, FNP  Brief History: 79 year old female with a history of stage IIIb colon cancer, hyperlipidemia, anxiety, chronic pain related to malignancy, psoriatic arthritis presenting with altered mental status. Patient's husband reports that4/5/21evening, she was in her usual state of health. She did complain of some nausea. It is unclear whether she took Zofran or accidentally took extra oxycodone for symptoms.On the morning of admission, with husband woke up, he noticed that he was unable to wake up the patient. EMS was called. When rescue arrived, they started to bag her. EMS provided Narcan which reportedly have some improvement in mental status. Husband reports that she had not been ill recently. No shortness of breath, cough or fever. On arrival to the emergency room, she was noted to be somnolent, pinpoint pupils. She received several more doses of Narcan with temporary improvement. She was started on Narcan infusion referred for admission.  Assessment/Plan: Acute toxic/metabolic encephalopathy -Likely secondary to hypnotic/opioid medications in addition to infectious process -Discontinue opioids and hypnotic medications for now and monitor clinically -Patient remains somnolent but arousable;does not follow commands -Avoid hypnotic and opioid medications unless absolutely necessary -Unfortunately, patient received a dose of Ativan on 07/23/2019 and 2345 -07/03/19--pt remains somnolent, waxing and waning mentation -check ammonia--13 -MRI brain -EEG--mild to moderate diffuse encephalopathy, non specific to etiology. No seizures or epileptiform discharges  -UA--neg for pyuria -ABG--7.47/32/61/24 on RA -repeat narcan--minimal response  Fever -Continue vancomycin pending culture data -Discontinue cefepime as this may also contribute to the patient's encephalopathy -Start Zosyn  empirically--concern about aspiration pneumonitis -MRSA screen negative -Personally reviewed chest x-ray--bibasilar atelectasis -CT chest--bilateral LL collapse/consolidation L>R, GGO of RUL -UA negative for pyuria  sepsis -Presented with fever 101.8 F, elevated lactic acid, and altered mental status -due to pnuemonia -Continue zosyn -d/c vanco -UA negative for pyuria -Continue IV fluids -Check procalcitonin--0.10  Pneumonia-lobar -as discussed above  Chronic pain syndrome--malignancy related pain -Holding opioids presently secondary to encephalopathy  Stage IIIb colon cancer -Patient [place] -Review of the medical record shows that the patient had opted to discontinue further treatment in July 2020  Acute urinary retention -Likely secondary to the patient's opioids and Tylenol PM -foley placed 4/7  Psoriatic arthritis -Chronically on methotrexate and prednisone which are on hold presently  Anxiety -Holding diazepam secondary to altered mental status  Hypokalemia -replete -check mag  Goals of Care -consult palliative medicine -currently DNR Advance care planning, including the explanation and discussion of advance directives was carried out with the patient and family.  Code status including explanations of "Full Code" and "DNR" and alternatives were discussed in detail.  Discussion of end-of-life issues including but not limited palliative care, hospice care and the concept of hospice, other end-of-life care options, power of attorney for health care decisions, living wills, and physician orders for life-sustaining treatment were also discussed with the patient and family.  Total face to face time 16 minutes.   Total time spent 40 minutes.  Greater than 50% spent face to face counseling and coordinating care.    Disposition Plan: Patient From: Home D/C Place: Homev residential hospice- 2-3 Days Barriers: Not Clinically Stable--remains  encephalopathic  Family Communication:spouse and daughter updated 4/9  Consultants:palliative  Code Status:DNR  DVT Prophylaxis: [place] Lovenox   Procedures: As Listed in Progress Note Above  Antibiotics: Zosyn 4/7>>> vanco4/6>>>4/8   Subjective: Pt occasionally speaks one word sentences.  Denies pain  or sob.  Remainder ROS not obtainable due to encephalopathy.  No vomiting or diarrhea  Objective: Vitals:   07/03/19 1200 07/03/19 1230 07/03/19 1300 07/03/19 1400  BP: (!) 150/94  (!) 148/73   Pulse: 96 95 84 84  Resp: (!) 27 (!) 35 (!) 32 (!) 25  Temp:      TempSrc:      SpO2: 96% 94% 98% 97%  Weight:      Height:        Intake/Output Summary (Last 24 hours) at 07/03/2019 1420 Last data filed at 07/03/2019 1355 Gross per 24 hour  Intake 2550.37 ml  Output 2000 ml  Net 550.37 ml   Weight change: -2.1 kg Exam:   General:  Pt is alert, follows does not commands appropriately, not in acute distress  HEENT: No icterus, No thrush, No neck mass, [place]/AT  Cardiovascular: RRR, S1/S2, no rubs, no gallops  Respiratory: bibasilar crackles; no wheeze  Abdomen: Soft/+BS, non tender, non distended, no guarding  Extremities: No edema, No lymphangitis, No petechiae, No rashes, no synovitis   Data Reviewed: I have personally reviewed following labs and imaging studies Basic Metabolic Panel: Recent Labs  Lab 07/02/2019 1021 07/01/19 0352 07/02/19 0356 07/03/19 0501  NA 139 142 141 141  K 3.7 3.7 3.4* 3.5  CL 103 109 109 109  CO2 24 26 23 23   GLUCOSE 120* 94 91 82  BUN 17 14 10 10   CREATININE 0.79 0.76 0.75 0.73  CALCIUM 9.3 8.4* 8.7* 8.4*   Liver Function Tests: Recent Labs  Lab 07/14/2019 1021 07/02/19 0356 07/03/19 0501  AST 27 30 27   ALT 18 19 17   ALKPHOS 81 62 61  BILITOT 0.4 0.9 0.9  PROT 8.1 5.8* 5.8*  ALBUMIN 4.1 2.8* 2.8*   No results for input(s): LIPASE, AMYLASE in the last 168 hours. Recent Labs  Lab 07/01/19 1403  AMMONIA 13    Coagulation Profile: No results for input(s): INR, PROTIME in the last 168 hours. CBC: Recent Labs  Lab 07/10/2019 1021 07/01/19 0352 07/02/19 0356 07/03/19 0501  WBC 10.0 10.9* 7.8 6.8  HGB 13.2 10.4* 10.3* 10.4*  HCT 45.4 34.8* 33.8* 33.9*  MCV 91.9 89.9 90.1 90.2  PLT 150 141* 152 159   Cardiac Enzymes: Recent Labs  Lab 07/22/2019 1021  CKTOTAL 46   BNP: Invalid input(s): POCBNP CBG: Recent Labs  Lab 07/02/19 1632 07/02/19 2008 07/03/19 0436 07/03/19 0755 07/03/19 1203  GLUCAP 86 82 72 65* 102*   HbA1C: No results for input(s): HGBA1C in the last 72 hours. Urine analysis:    Component Value Date/Time   COLORURINE YELLOW 07/12/2019 [place] 07/23/2019 0954   LABSPEC 1.009 07/18/2019 0954   PHURINE 5.0 07/05/2019 0954   GLUCOSEU NEGATIVE 07/11/2019 0954   HGBUR NEGATIVE 07/12/2019 [place] 07/11/2019 [place] 07/18/2019 0954   PROTEINUR NEGATIVE 07/05/2019 0954   NITRITE NEGATIVE 07/21/2019 0954   LEUKOCYTESUR NEGATIVE 07/08/2019 0954   Sepsis Labs: @LABRCNTIP (procalcitonin:4,lacticidven:4) ) Recent Results (from the past 240 hour(s))  Respiratory Panel by RT PCR (Flu A&B, Covid) - Nasopharyngeal Swab     Status: None   Collection Time: 07/10/2019 11:28 AM   Specimen: Nasopharyngeal Swab  Result Value Ref Range Status   SARS Coronavirus 2 by RT PCR NEGATIVE NEGATIVE Final    Comment: (NOTE) SARS-CoV-2 target nucleic acids are NOT DETECTED. The SARS-CoV-2 RNA is generally detectable in upper respiratoy specimens during the acute phase of  infection. The lowest concentration of SARS-CoV-2 viral copies this assay can detect is 131 copies/mL. A negative result does not preclude SARS-Cov-2 infection and should not be used as the sole basis for treatment or other patient management decisions. A negative result may occur with  improper specimen collection/handling, submission of specimen other than  nasopharyngeal swab, presence of viral mutation(s) within the areas targeted by this assay, and inadequate number of viral copies (<131 copies/mL). A negative result must be combined with clinical observations, patient history, and epidemiological information. The expected result is Negative. Fact Sheet for Patients:  PinkCheek.be Fact Sheet for Healthcare Providers:  GravelBags.it This test is not yet ap proved or cleared by the [place] FDA and  has been authorized for detection and/or diagnosis of SARS-CoV-2 by FDA under an Emergency Use Authorization (EUA). This EUA will remain  in effect (meaning this test can be used) for the duration of the COVID-19 declaration under Section 564(b)(1) of the Act, 21 U.S.C. section 360bbb-3(b)(1), unless the authorization is terminated or revoked sooner.    Influenza A by PCR NEGATIVE NEGATIVE Final   Influenza B by PCR NEGATIVE NEGATIVE Final    Comment: (NOTE) The Xpert Xpress SARS-CoV-2/FLU/RSV assay is intended as an aid in  the diagnosis of influenza from Nasopharyngeal swab specimens and  should not be used as a sole basis for treatment. Nasal washings and  aspirates are unacceptable for Xpert Xpress SARS-CoV-2/FLU/RSV  testing. Fact Sheet for Patients: PinkCheek.be Fact Sheet for Healthcare Providers: GravelBags.it This test is not yet approved or cleared by the [place] FDA and  has been authorized for detection and/or diagnosis of SARS-CoV-2 by  FDA under an Emergency Use Authorization (EUA). This EUA will remain  in effect (meaning this test can be used) for the duration of the  Covid-19 declaration under Section 564(b)(1) of the Act, 21  U.S.C. section 360bbb-3(b)(1), unless the authorization is  terminated or revoked. Performed at [facility], [address] [place], [place] [address]   MRSA PCR Screening      Status: None   Collection Time: 07/05/2019 12:45 PM   Specimen: Nasal Mucosa; Nasopharyngeal  Result Value Ref Range Status   MRSA by PCR NEGATIVE NEGATIVE Final    Comment:        The GeneXpert MRSA Assay (FDA approved for NASAL specimens only), is one component of a comprehensive MRSA colonization surveillance program. It is not intended to diagnose MRSA infection nor to guide or monitor treatment for MRSA infections. Performed at [facility], [address] [place], [place] [address]   Culture, blood (routine x 2)     Status: None (Preliminary result)   Collection Time: 07/18/2019  6:14 PM   Specimen: BLOOD RIGHT HAND  Result Value Ref Range Status   Specimen Description BLOOD RIGHT HAND  Final   Special Requests   Final    BOTTLES DRAWN AEROBIC AND ANAEROBIC Blood Culture adequate volume   Culture   Final    NO GROWTH 3 DAYS Performed at [facility], [address] [place], [place] [address]    Report Status PENDING  Incomplete  Culture, blood (routine x 2)     Status: None (Preliminary result)   Collection Time: 07/05/2019  6:21 PM   Specimen: BLOOD LEFT HAND  Result Value Ref Range Status   Specimen Description BLOOD LEFT HAND  Final   Special Requests   Final    BOTTLES DRAWN AEROBIC ONLY Blood Culture adequate volume   Culture  Final    NO GROWTH 3 DAYS Performed at [facility], [address] [place], [place] [address]    Report Status PENDING  Incomplete     Scheduled Meds: . Chlorhexidine Gluconate Cloth  6 each Topical Daily   Continuous Infusions:  Procedures/Studies: DG Chest 1 View  Result Date: 07/20/2019 CLINICAL DATA:  Found unresponsive EXAM: CHEST  1 VIEW COMPARISON:  None. FINDINGS: Right chest wall port with catheter tip overlying the superior right atrium. Patchy bibasilar atelectasis. No significant pleural effusion. No pneumothorax. Cardiomediastinal contours are within limits. Surgical clips overlie the right chest wall. IMPRESSION: Patchy  bibasilar atelectasis. Electronically Signed   By: Macy Mis M.D.   On: 07/11/2019 10:57   CT HEAD WO CONTRAST  Result Date: 07/16/2019 CLINICAL DATA:  Found unresponsive EXAM: CT HEAD WITHOUT CONTRAST TECHNIQUE: Contiguous axial images were obtained from the base of the skull through the vertex without intravenous contrast. COMPARISON:  None. FINDINGS: Brain: Chronic atrophic changes are noted. No findings to suggest acute hemorrhage or acute infarction are seen. 10 mm calcified meningioma is noted in the posterior fossa on the left. Vascular: No hyperdense vessel or unexpected calcification. Skull: Normal. Negative for fracture or focal lesion. Sinuses/Orbits: No acute finding. Other: None. IMPRESSION: Chronic atrophic changes. Left posterior fossa meningioma. No acute abnormality noted. Electronically Signed   By: Inez Catalina M.D.   On: 07/23/2019 10:39   CT CHEST WO CONTRAST  Result Date: 07/01/2019 CLINICAL DATA:  Cough with persistent dyspnea. EXAM: CT CHEST WITHOUT CONTRAST TECHNIQUE: Multidetector CT imaging of the chest was performed following the standard protocol without IV contrast. COMPARISON:  11/02/2014 FINDINGS: Cardiovascular: The heart size is normal. No substantial pericardial effusion. Coronary artery calcification is evident. Atherosclerotic calcification is noted in the wall of the thoracic aorta. Right Port-A-Cath tip is positioned in the upper right atrium. Mediastinum/Nodes: No mediastinal lymphadenopathy. No evidence for gross hilar lymphadenopathy although assessment is limited by the lack of intravenous contrast on today's study. The esophagus has normal imaging features. There is no axillary lymphadenopathy. Lungs/Pleura: Patchy areas of ground-glass attenuation are seen in the right upper lobe with bilateral lower lobe collapse/consolidation, left greater than right. Subpleural reticulation anterior right upper lobe may be related to radiation fibrosis. No substantial  pleural effusion. Upper Abdomen: Soft tissue attenuation is identified between the stomach and the lateral segment left liver, potentially bowel but indeterminate on this noncontrast CT. Musculoskeletal: No worrisome lytic or sclerotic osseous abnormality. IMPRESSION: 1. Bilateral lower lobe collapse/consolidation, left greater than right is associated with patchy areas of ground-glass attenuation in the right upper lung. Imaging features suggest multifocal pneumonia. 2. Soft tissue attenuation between the stomach and the lateral segment left liver, potentially bowel but somewhat atypical in appearance on coronal reconstructions. Dedicated abdomen/pelvis CT with oral and intravenous contrast (renal function permitting) recommended to further evaluate. 3. Aortic Atherosclerosis (ICD10-I70.0). Electronically Signed   By: Misty Stanley M.D.   On: 07/01/2019 14:44   MR BRAIN WO CONTRAST  Result Date: 07/02/2019 CLINICAL DATA:  Encephalopathy. EXAM: MRI HEAD WITHOUT CONTRAST TECHNIQUE: Multiplanar, multiecho pulse sequences of the brain and surrounding structures were obtained without intravenous contrast. COMPARISON:  Head CT June 30, 2019 FINDINGS: Severely degraded images by motion. Patient unable to hold still for the duration of the study. Brain: No large acute infarction, hemorrhage, hydrocephalus or extra-axial collection. 1.4 cm dural-based lesion in the left cerebellopontine angle cistern corresponds to the meningioma seen on prior CT. Vascular: Grossly maintained. Skull and  upper cervical spine: Unable to assess due to motion artifact. Sinuses/Orbits: Grossly normal. IMPRESSION: 1. Severely degraded images due to motion artifact. Patient unable to hold still for the duration of the study. 2. No acute intracranial abnormality identified. 3. A 1.4 cm dural-based lesion in the left cerebellopontine angle cistern corresponds to the meningioma seen on prior CT. Electronically Signed   By: Pedro Earls M.D.   On: 07/02/2019 14:45   EEG adult  Result Date: 07/03/2019 Lora Havens, MD     07/03/2019 10:47 AM Patient Name: Myeisha Sangha MRN: [id number] Epilepsy Attending: Lora Havens Referring Physician/Provider: Dr Shanon Brow Shalom Ware Date: 07/02/2019 Duration: 24 mins Patient history: 79yo F admitted for ams. EEG to evaluate for seizure Level of alertness: awake AEDs during EEG study: None Technical aspects: This EEG study was done with scalp electrodes positioned according to the 10-20 International system of electrode placement. Electrical activity was acquired at a sampling rate of 500Hz  and reviewed with a high frequency filter of 70Hz  and a low frequency filter of 1Hz . EEG data were recorded continuously and digitally stored. DESCRIPTION: No clear posterior dominant rhythm was seen. EEG showed generalized polymorphic 8-9hz  alpha activity as well as intermittent 2-3hz  low amplitude delta activity. Hyperventilation and photic stimulation were not performed. ABNORMALITY - Intermittent slow, generalized IMPRESSION: This study is suggestive of mild to moderate diffuse encephalopathy, non specific to etiology. No seizures or epileptiform discharges were seen throughout the recording. [place], DO  [facility]  If 7PM-7AM, please contact night-coverage [email or web address] Password [email or web address] 07/03/2019, 2:20 PM   LOS: 2 days

## 2019-07-03 NOTE — Progress Notes (Signed)
SLP Cancellation Note  Patient Details Name: Chelsea Mayo MRN: [id number] DOB: [birth year]   Cancelled treatment:       Reason Eval/Treat Not Completed: Fatigue/lethargy limiting ability to participate;Patient's level of consciousness;Patient not medically ready. Pt did not respond to max verbal cues or rouse while SLP was in the room; . Pt is not appropriate for PO trials at this time, ST will continue efforts.  Laddie Naeem H. Roddie Mc, CCC-SLP Speech Language Pathologist   Wende Bushy 07/03/2019, 10:30 AM

## 2019-07-03 NOTE — Procedures (Signed)
Patient Name: Chelsea Mayo  MRN: [id number]  Epilepsy Attending: Lora Havens  Referring Physician/Provider: Dr Shanon Brow Tat Date: 07/02/2019 Duration: 24 mins  Patient history: 79yo F admitted for ams. EEG to evaluate for seizure  Level of alertness: awake  AEDs during EEG study: None  Technical aspects: This EEG study was done with scalp electrodes positioned according to the 10-20 International system of electrode placement. Electrical activity was acquired at a sampling rate of 500Hz  and reviewed with a high frequency filter of 70Hz  and a low frequency filter of 1Hz . EEG data were recorded continuously and digitally stored.   DESCRIPTION: No clear posterior dominant rhythm was seen. EEG showed generalized polymorphic 8-9hz  alpha activity as well as intermittent 2-3hz  low amplitude delta activity. Hyperventilation and photic stimulation were not performed.  ABNORMALITY - Intermittent slow, generalized  IMPRESSION: This study is suggestive of mild to moderate diffuse encephalopathy, non specific to etiology. No seizures or epileptiform discharges were seen throughout the recording.    [name] Barbra Sarks

## 2019-07-03 NOTE — Progress Notes (Signed)
Daily Progress Note   Patient Name: Chelsea Mayo       Date: 07/03/2019 DOB: 12/28/1940  Age: 79 y.o. MRN#: [id number] Attending Physician: Orson Eva, MD Primary Care Physician: Kennyth Arnold, FNP Admit Date: 06/28/2019  Reason for Consultation/Follow-up: Establishing goals of care  Subjective: Timberlee is in bed- appears comfortable- she does not respond to my voice. Met with patient's spouse- Gershon Mussel, and daughter, Sonia Baller.  Both verbalized their mother's difficult year- she has had some good days since stopping her chemotherapy in July, but they have noticed that she was having more pain, increasing difficulty breathing, and decreased appetite over the last few months.  Kaelynn has previously made it clear to them that her primary goal is not to be in pain. Roby also has a MOST form completed that indicated limited medical interventions, IV fluids for trial period, DNR, no feeding tube.  We reviewed Hannalee's current state and comorbidities.  Discussed options for continued medical care with goal of prolonging time, vs transition to full comfort- stopping IVF, labs, cardiac monitoring, and other interventions that were not needed for comfort.  Role of nutrition and hydration at end of life reviewed with family.  All agreed that Gabrille would wish for transition to full comfort without further measures aimed at prolonging her time.  Sonia Baller noted that patient was concerned about not having a bowel movement yesterday- agrees to suppository for comfort.  We discussed utilizing scheduled pain medication with breakthrough as needed to ensure she did not have further pain. Family is aware that this may keep Luvia from being awake as her mental status is poor- they are in agreement with this plan.   Review of  Systems  Unable to perform ROS: Mental status change    Length of Stay: 2  Current Medications: Scheduled Meds:  . Chlorhexidine Gluconate Cloth  6 each Topical Daily  . Glycerin (Adult)  1 suppository Rectal Once  .  HYDROmorphone (DILAUDID) injection  0.5 mg Intravenous Q4H    Continuous Infusions:   PRN Meds: acetaminophen **OR** acetaminophen, antiseptic oral rinse, bisacodyl, glycopyrrolate **OR** glycopyrrolate **OR** glycopyrrolate, haloperidol **OR** [DISCONTINUED] haloperidol **OR** haloperidol lactate, HYDROmorphone (DILAUDID) injection, LORazepam **OR** [DISCONTINUED] LORazepam **OR** LORazepam, ondansetron **OR** ondansetron (ZOFRAN) IV, polyvinyl alcohol  Physical Exam Vitals and nursing note reviewed.  Constitutional:  Appearance: She is ill-appearing.     Comments: Frail  Pulmonary:     Effort: Pulmonary effort is normal.  Neurological:     Comments: nonarouseable             Vital Signs: BP (!) 148/73   Pulse 84   Temp 98.3 F (36.8 C) (Oral)   Resp (!) 25   Ht _0  (1.626 m)   Wt 65.5 kg   SpO2 97%   BMI 24.79 kg/m  SpO2: SpO2: 97 % O2 Device: O2 Device: Room Air O2 Flow Rate: O2 Flow Rate (L/min): 0 L/min  Intake/output summary:   Intake/Output Summary (Last 24 hours) at 07/03/2019 1430 Last data filed at 07/03/2019 1355 Gross per 24 hour  Intake 2550.37 ml  Output 2000 ml  Net 550.37 ml   LBM:   Baseline Weight: Weight: 66.2 kg Most recent weight: Weight: 65.5 kg       Palliative Assessment/Data: PPS: 10%     Patient Active Problem List   Diagnosis Date Noted  . Acute respiratory failure ([place])   . Opioid overdose ([place])   . Goals of care, counseling/discussion 07/02/2019  . Palliative care by specialist   . DNR (do not resuscitate)   . Sepsis due to undetermined organism ([place]) 07/01/2019  . Acute metabolic encephalopathy 16/12/9602  . Opiate overdose ([place]) 07/16/2019  . Acute encephalopathy 07/04/2019  . Colon cancer ([place])  07/09/2019  . Chronic pain 07/23/2019  . Anxiety 06/26/2019  . Fever 07/14/2019  . Acute urinary retention 07/02/2019  . Pure hypercholesterolemia 01/02/2013  . Psoriatic arthritis ([place]) 01/02/2013  . Osteopenia 01/02/2013    Palliative Care Assessment & Plan   Patient Profile: 79 y.o. female  with past medical history of anxiety, psoriatic arthritis, Stage IIIb colon cancer- s/p chemotherapy received at [facility]- treatment stopped July 2020 after progression on treatment and poor quality of life- she did not enroll in Hospice at that time- admitted on 07/12/2019 with altered mental status. She was found down at home, unresponsive, given Narcan with limited response and started on narcan infusion. Further workup reveals sepsis related to pneumonia. Palliative medicine consulted for goals of care.   Assessment/Recommendations/Plan   Transition to full comfort measures only  Hydromorphone .50m q4hrs with .[id number] prn for pain or SOB  Other comfort medications as ordered  D/C cardiac monitoring, IVF, and other medications not intended for comfort  Goals of Care and Additional Recommendations:  Limitations on Scope of Treatment: Full Comfort Care  Code Status:  DNR  Prognosis:   Hours - Days  Discharge Planning:  Anticipated Hospital Death discussed possible hospice home placement- however, I am concerned that when we transition to full comfort measures only- patient will decline rapidly and not be stable for transition out of hospital- recommend primary team reevaluate stability for transfer out of facility to hospice home tomorrow. This was discussed with family.  Care plan was discussed with patient's family.  Thank you for allowing the Palliative Medicine Team to assist in the care of this patient.   Time In: 1315 Time Out: 1455 Total Time 100 mins Prolonged Time Billed yes      Greater than 50%  of this time was spent counseling and coordinating care related to the  above assessment and plan.  KaMariana KaufmanAGNP-C Palliative Medicine   Please contact Palliative Medicine Team phone at [phone] questions and concerns.

## 2019-07-04 MED ORDER — HYDROMORPHONE HCL 1 MG/ML IJ SOLN
0.5000 mg | INTRAMUSCULAR | Status: DC
  Administered 2019-07-04 – 2019-07-06 (×19): 0.5 mg via INTRAVENOUS
  Filled 2019-07-04 (×19): qty 0.5

## 2019-07-04 NOTE — Progress Notes (Signed)
PROGRESS NOTE  Chelsea Mayo [id number] DOB: 27-Sep-1940 DOA: 06/27/2019 PCP: Kennyth Arnold, FNP Brief History: 79 year old female with a history of stage IIIb colon cancer, hyperlipidemia, anxiety, chronic pain related to malignancy, psoriatic arthritis presenting with altered mental status. Patient's husband reports that4/5/21evening, she was in her usual state of health. She did complain of some nausea. It is unclear whether she took Zofran or accidentally took extra oxycodone for symptoms.On the morning of admission, with husband woke up, he noticed that he was unable to wake up the patient. EMS was called. When rescue arrived, they started to bag her. EMS provided Narcan which reportedly have some improvement in mental status. Husband reports that she had not been ill recently. No shortness of breath, cough or fever. On arrival to the emergency room, she was noted to be somnolent, pinpoint pupils. She received several more doses of Narcan with temporary improvement. She was started on Narcan infusion referred for admission.  After admission, reversible causes of encephalopath were investigated and addressed.  However, the patient continued to remain encephalopathic with waxing and waning periods of wakefulness and agitation.  Goals of care discussions and advanced care planning was discussed with family.  Palliative medicine was ultimately consulted.  After several family meetings, family felt it was in the best interest of the patient to change her focus of care directed toward full comfort given the patient's multiple comorbidities and poor prognosis.  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 -Discontinue opioids and hypnotic medications for now and monitor clinically -Patient remains  somnolent but arousable;does not follow commands -Avoid hypnotic and opioid medications unless absolutely necessary -Unfortunately, patient received a dose of Ativan on 07/14/2019 and 2345 -07/03/19--pt remains somnolent, waxing and waning mentation -check ammonia--13 -MRI brain -EEG--mild to moderate diffuse encephalopathy, non specific to etiology.No seizures or epileptiform discharges  -UA--neg for pyuria -ABG--7.47/32/61/24 on RA -repeat narcan--minimal response - patient continued to remain encephalopathic with waxing and waning periods of wakefulness and agitation.  Goals of care discussions and advanced care planning was discussed with family.  Palliative medicine was ultimately consulted.  After several family meetings, family felt it was in the best interest of the patient to change her focus of care directed toward full comfort given the patient's multiple comorbidities and poor prognosis.  Fever -Continue vancomycin pending culture data -Discontinue cefepime as this may also contribute to the patient's encephalopathy -Start Zosyn empirically--concern about aspiration pneumonitis -MRSA screen negative -Personally reviewed chest x-ray--bibasilar atelectasis -CT chest--bilateral LL collapse/consolidation L>R, GGO of RUL -UA negative for pyuria  sepsis -Presented with fever 101.8 F, elevated lactic acid, and altered mental status -due to pnuemonia -Continuedzosyn initially -d/c vanco -UA negative for pyuria -Continue IV fluids -Check procalcitonin--0.10 -sepsis physiology resolved  Pneumonia-lobar -as discussed above  Chronic pain syndrome--malignancy related pain -Holding opioids presently secondary to encephalopathy initially -now focused on full comfort  Stage IIIb colon cancer -Patient [place] -Review of the medical record shows that the patient had opted to discontinue further treatment in July 2020 -now focused on full comfort  Acute urinary  retention -Likely secondary to the patient's opioids and Tylenol PM -foley placed 4/7  Psoriatic arthritis -Chronically on methotrexate and prednisone which are on hold presently  Anxiety -Holding diazepam secondary to altered mental status -now focused on full comfort  Hypokalemia -replete -now focus on full comfort  Goals of Care -consult palliative medicine -currently DNR -patient continued to remain encephalopathic with waxing and waning periods of wakefulness and agitation.  Goals of care discussions and advanced care planning was discussed with family.  Palliative medicine was ultimately consulted.  After several family meetings, family felt it was in the best interest of the patient to change her focus of care directed toward full comfort given the patient's multiple comorbidities and poor prognosis.   Total time spent 35 minutes.  Greater than 50% spent face to face counseling and coordinating care.    Disposition Plan: Patient From: Home D/C Place: In-hospital deathv residential hospice   Family Communication:son and daughter updated 4/10  Consultants:palliative  Code Status:FULL COMFORT  DVT Prophylaxis: FULL COMFORT   Procedures: As Listed in Progress Note Above  Antibiotics: Zosyn 4/7>>>4/9 vanco4/6>>>4/8  Subjective: Pt is somnolent; occasionally arouses to voice.  Does not follow commands.  No respiratory distress. No vomiting.  Occasional moaning in pain  Objective: Vitals:   07/03/19 1400 07/03/19 1600 07/03/19 1645 07/04/19 0055  BP:  122/68  (!) 147/82  Pulse: 84 75 69 87  Resp: (!) 25 (!) 24 (!) 22 (!) 22  Temp:  97.9 F (36.6 C)    TempSrc:  Axillary    SpO2: 97% 93% 96% 91%  Weight:      Height:        Intake/Output Summary (Last 24 hours) at 07/04/2019 0919 Last data filed at 07/03/2019 1355 Gross per 24 hour  Intake 464.13 ml  Output --  Net 464.13 ml   Weight change:  Exam:   General:  Pt does not  follows command appropriately, not in acute distress  HEENT: No icterus,[place]/AT  Cardiovascular: RRR, S1/S2, no rubs, no gallops  Respiratory: poor inspiratory effort;  Bibasilar crackles  Abdomen: Soft/+BS, non tender, non distended  Extremities: No edema, No lymphangitis, No petechiae, No rashes, no synovitis   Data Reviewed: I have personally reviewed following labs and imaging studies Basic Metabolic Panel: Recent Labs  Lab 06/28/2019 1021 07/01/19 0352 07/02/19 0356 07/03/19 0501  NA 139 142 141 141  K 3.7 3.7 3.4* 3.5  CL 103 109 109 109  CO2 24 26 23 23   GLUCOSE 120* 94 91 82  BUN 17 14 10 10   CREATININE 0.79 0.76 0.75 0.73  CALCIUM 9.3 8.4* 8.7* 8.4*   Liver Function Tests: Recent Labs  Lab 07/05/2019 1021 07/02/19 0356 07/03/19 0501  AST 27 30 27   ALT 18 19 17   ALKPHOS 81 62 61  BILITOT 0.4 0.9 0.9  PROT 8.1 5.8* 5.8*  ALBUMIN 4.1 2.8* 2.8*   No results for input(s): LIPASE, AMYLASE in the last 168 hours. Recent Labs  Lab 07/01/19 1403  AMMONIA 13   Coagulation Profile: No results for input(s): INR, PROTIME in the last 168 hours. CBC: Recent Labs  Lab 07/07/2019 1021 07/01/19 0352 07/02/19 0356 07/03/19 0501  WBC 10.0 10.9* 7.8 6.8  HGB 13.2 10.4* 10.3* 10.4*  HCT 45.4 34.8* 33.8* 33.9*  MCV 91.9 89.9 90.1 90.2  PLT 150 141* 152 159   Cardiac Enzymes: Recent Labs  Lab 07/22/2019 1021  CKTOTAL 46   BNP: Invalid input(s): POCBNP CBG: Recent Labs  Lab 07/02/19 2008 07/03/19 0436 07/03/19 0755 07/03/19 1203 07/03/19 1613  GLUCAP 82 72 65* 102* 84   HbA1C: No results for input(s): HGBA1C in the last 72 hours. Urine analysis:    Component Value Date/Time   COLORURINE YELLOW 07/14/2019 [place]  07/05/2019 0954   LABSPEC 1.009 07/16/2019 [place] 5.0 07/10/2019 [place] 07/22/2019 0954   HGBUR NEGATIVE 07/22/2019 [place] 07/20/2019 [place] 07/17/2019 0954     PROTEINUR NEGATIVE 07/09/2019 0954   NITRITE NEGATIVE 06/26/2019 0954   LEUKOCYTESUR NEGATIVE 07/18/2019 0954   Sepsis Labs: @LABRCNTIP (procalcitonin:4,lacticidven:4) ) Recent Results (from the past 240 hour(s))  Respiratory Panel by RT PCR (Flu A&B, Covid) - Nasopharyngeal Swab     Status: None   Collection Time: 06/29/2019 11:28 AM   Specimen: Nasopharyngeal Swab  Result Value Ref Range Status   SARS Coronavirus 2 by RT PCR NEGATIVE NEGATIVE Final    Comment: (NOTE) SARS-CoV-2 target nucleic acids are NOT DETECTED. The SARS-CoV-2 RNA is generally detectable in upper respiratoy specimens during the acute phase of infection. The lowest concentration of SARS-CoV-2 viral copies this assay can detect is 131 copies/mL. A negative result does not preclude SARS-Cov-2 infection and should not be used as the sole basis for treatment or other patient management decisions. A negative result may occur with  improper specimen collection/handling, submission of specimen other than nasopharyngeal swab, presence of viral mutation(s) within the areas targeted by this assay, and inadequate number of viral copies (<131 copies/mL). A negative result must be combined with clinical observations, patient history, and epidemiological information. The expected result is Negative. Fact Sheet for Patients:  PinkCheek.be Fact Sheet for Healthcare Providers:  GravelBags.it This test is not yet ap proved or cleared by the [place] FDA and  has been authorized for detection and/or diagnosis of SARS-CoV-2 by FDA under an Emergency Use Authorization (EUA). This EUA will remain  in effect (meaning this test can be used) for the duration of the COVID-19 declaration under Section 564(b)(1) of the Act, 21 U.S.C. section 360bbb-3(b)(1), unless the authorization is terminated or revoked sooner.    Influenza A by PCR NEGATIVE NEGATIVE Final   Influenza  B by PCR NEGATIVE NEGATIVE Final    Comment: (NOTE) The Xpert Xpress SARS-CoV-2/FLU/RSV assay is intended as an aid in  the diagnosis of influenza from Nasopharyngeal swab specimens and  should not be used as a sole basis for treatment. Nasal washings and  aspirates are unacceptable for Xpert Xpress SARS-CoV-2/FLU/RSV  testing. Fact Sheet for Patients: PinkCheek.be Fact Sheet for Healthcare Providers: GravelBags.it This test is not yet approved or cleared by the [place] FDA and  has been authorized for detection and/or diagnosis of SARS-CoV-2 by  FDA under an Emergency Use Authorization (EUA). This EUA will remain  in effect (meaning this test can be used) for the duration of the  Covid-19 declaration under Section 564(b)(1) of the Act, 21  U.S.C. section 360bbb-3(b)(1), unless the authorization is  terminated or revoked. Performed at [facility], [address], [place], [place] [address]   MRSA PCR Screening     Status: None   Collection Time: 07/12/2019 12:45 PM   Specimen: Nasal Mucosa; Nasopharyngeal  Result Value Ref Range Status   MRSA by PCR NEGATIVE NEGATIVE Final    Comment:        The GeneXpert MRSA Assay (FDA approved for NASAL specimens only), is one component of a comprehensive MRSA colonization surveillance program. It is not intended to diagnose MRSA infection nor to guide or monitor treatment for MRSA infections. Performed at [facility], [address], [place], [place] [address]   Culture, blood (routine x 2)     Status: None (Preliminary result)  Collection Time: 07/22/2019  6:14 PM   Specimen: BLOOD RIGHT HAND  Result Value Ref Range Status   Specimen Description BLOOD RIGHT HAND  Final   Special Requests   Final    BOTTLES DRAWN AEROBIC AND ANAEROBIC Blood Culture adequate volume   Culture   Final    NO GROWTH 4 DAYS Performed at [facility], [address] [place], [place] [address]     Report Status PENDING  Incomplete  Culture, blood (routine x 2)     Status: None (Preliminary result)   Collection Time: 06/26/2019  6:21 PM   Specimen: BLOOD LEFT HAND  Result Value Ref Range Status   Specimen Description BLOOD LEFT HAND  Final   Special Requests   Final    BOTTLES DRAWN AEROBIC ONLY Blood Culture adequate volume   Culture   Final    NO GROWTH 4 DAYS Performed at [facility], [address] [place],  [address]    Report Status PENDING  Incomplete     Scheduled Meds:   HYDROmorphone (DILAUDID) injection  0.5 mg Intravenous Q4H   Continuous Infusions:  Procedures/Studies: DG Chest 1 View  Result Date: 07/08/2019 CLINICAL DATA:  Found unresponsive EXAM: CHEST  1 VIEW COMPARISON:  None. FINDINGS: Right chest wall port with catheter tip overlying the superior right atrium. Patchy bibasilar atelectasis. No significant pleural effusion. No pneumothorax. Cardiomediastinal contours are within limits. Surgical clips overlie the right chest wall. IMPRESSION: Patchy bibasilar atelectasis. Electronically Signed   By: Macy Mis M.D.   On: 07/10/2019 10:57   CT HEAD WO CONTRAST  Result Date: 06/25/2019 CLINICAL DATA:  Found unresponsive EXAM: CT HEAD WITHOUT CONTRAST TECHNIQUE: Contiguous axial images were obtained from the base of the skull through the vertex without intravenous contrast. COMPARISON:  None. FINDINGS: Brain: Chronic atrophic changes are noted. No findings to suggest acute hemorrhage or acute infarction are seen. 10 mm calcified meningioma is noted in the posterior fossa on the left. Vascular: No hyperdense vessel or unexpected calcification. Skull: Normal. Negative for fracture or focal lesion. Sinuses/Orbits: No acute finding. Other: None. IMPRESSION: Chronic atrophic changes. Left posterior fossa meningioma. No acute abnormality noted. Electronically Signed   By: Inez Catalina M.D.   On: 06/26/2019 10:39   CT CHEST WO CONTRAST  Result Date:  07/01/2019 CLINICAL DATA:  Cough with persistent dyspnea. EXAM: CT CHEST WITHOUT CONTRAST TECHNIQUE: Multidetector CT imaging of the chest was performed following the standard protocol without IV contrast. COMPARISON:  11/02/2014 FINDINGS: Cardiovascular: The heart size is normal. No substantial pericardial effusion. Coronary artery calcification is evident. Atherosclerotic calcification is noted in the wall of the thoracic aorta. Right Port-A-Cath tip is positioned in the upper right atrium. Mediastinum/Nodes: No mediastinal lymphadenopathy. No evidence for gross hilar lymphadenopathy although assessment is limited by the lack of intravenous contrast on today's study. The esophagus has normal imaging features. There is no axillary lymphadenopathy. Lungs/Pleura: Patchy areas of ground-glass attenuation are seen in the right upper lobe with bilateral lower lobe collapse/consolidation, left greater than right. Subpleural reticulation anterior right upper lobe may be related to radiation fibrosis. No substantial pleural effusion. Upper Abdomen: Soft tissue attenuation is identified between the stomach and the lateral segment left liver, potentially bowel but indeterminate on this noncontrast CT. Musculoskeletal: No worrisome lytic or sclerotic osseous abnormality. IMPRESSION: 1. Bilateral lower lobe collapse/consolidation, left greater than right is associated with patchy areas of ground-glass attenuation in the right upper lung. Imaging features suggest multifocal pneumonia. 2. Soft tissue  attenuation between the stomach and the lateral segment left liver, potentially bowel but somewhat atypical in appearance on coronal reconstructions. Dedicated abdomen/pelvis CT with oral and intravenous contrast (renal function permitting) recommended to further evaluate. 3. Aortic Atherosclerosis (ICD10-I70.0). Electronically Signed   By: Misty Stanley M.D.   On: 07/01/2019 14:44   MR BRAIN WO CONTRAST  Result Date:  07/02/2019 CLINICAL DATA:  Encephalopathy. EXAM: MRI HEAD WITHOUT CONTRAST TECHNIQUE: Multiplanar, multiecho pulse sequences of the brain and surrounding structures were obtained without intravenous contrast. COMPARISON:  Head CT June 30, 2019 FINDINGS: Severely degraded images by motion. Patient unable to hold still for the duration of the study. Brain: No large acute infarction, hemorrhage, hydrocephalus or extra-axial collection. 1.4 cm dural-based lesion in the left cerebellopontine angle cistern corresponds to the meningioma seen on prior CT. Vascular: Grossly maintained. Skull and upper cervical spine: Unable to assess due to motion artifact. Sinuses/Orbits: Grossly normal. IMPRESSION: 1. Severely degraded images due to motion artifact. Patient unable to hold still for the duration of the study. 2. No acute intracranial abnormality identified. 3. A 1.4 cm dural-based lesion in the left cerebellopontine angle cistern corresponds to the meningioma seen on prior CT. Electronically Signed   By: Pedro Earls M.D.   On: 07/02/2019 14:45   EEG adult  Result Date: 07/03/2019 Lora Havens, MD     07/03/2019 10:47 AM Patient Name: Chelsea Mayo MRN: [id number] Epilepsy Attending: Lora Havens Referring Physician/Provider: Dr Shanon Brow Kerston Landeck Date: 07/02/2019 Duration: 24 mins Patient history: 79yo F admitted for ams. EEG to evaluate for seizure Level of alertness: awake AEDs during EEG study: None Technical aspects: This EEG study was done with scalp electrodes positioned according to the 10-20 International system of electrode placement. Electrical activity was acquired at a sampling rate of 500Hz  and reviewed with a high frequency filter of 70Hz  and a low frequency filter of 1Hz . EEG data were recorded continuously and digitally stored. DESCRIPTION: No clear posterior dominant rhythm was seen. EEG showed generalized polymorphic 8-9hz  alpha activity as well as intermittent 2-3hz  low amplitude delta  activity. Hyperventilation and photic stimulation were not performed. ABNORMALITY - Intermittent slow, generalized IMPRESSION: This study is suggestive of mild to moderate diffuse encephalopathy, non specific to etiology. No seizures or epileptiform discharges were seen throughout the recording. [place], DO  [facility]  If 7PM-7AM, please contact night-coverage [email or web address] Password [email or web address] 07/04/2019, 9:19 AM   LOS: 3 days

## 2019-07-04 NOTE — Progress Notes (Signed)
Report given and patient transferred to med.surg floor. 2 family members are with the patient due to comfort measures status and in accordance with current visitation policy.

## 2019-07-05 LAB — CULTURE, BLOOD (ROUTINE X 2)
Culture: NO GROWTH
Culture: NO GROWTH
Special Requests: ADEQUATE
Special Requests: ADEQUATE

## 2019-07-05 MED ORDER — SCOPOLAMINE 1 MG/3DAYS TD PT72
1.0000 | MEDICATED_PATCH | TRANSDERMAL | Status: DC
  Administered 2019-07-05: 1.5 mg via TRANSDERMAL
  Filled 2019-07-05: qty 1

## 2019-07-05 NOTE — Progress Notes (Signed)
PROGRESS NOTE  Lurine Mcnealy [id number] DOB: 31-Aug-1940 DOA: 06/25/2019 PCP: Kennyth Arnold, FNP  Brief History: 79 year old female with a history of stage IIIb colon cancer, hyperlipidemia, anxiety, chronic pain related to malignancy, psoriatic arthritis presenting with altered mental status. Patient's husband reports that4/5/21evening, she was in her usual state of health. She did complain of some nausea. It is unclear whether she took Zofran or accidentally took extra oxycodone for symptoms.On the morning of admission, with husband woke up, he noticed that he was unable to wake up the patient. EMS was called. When rescue arrived, they started to bag her. EMS provided Narcan which reportedly have some improvement in mental status. Husband reports that she had not been ill recently. No shortness of breath, cough or fever. On arrival to the emergency room, she was noted to be somnolent, pinpoint pupils. She received several more doses of Narcan with temporary improvement. She was started on Narcan infusion referred for admission.  After admission, reversible causes of encephalopath were investigated and addressed.  However, the patient continued to remain encephalopathic with waxing and waning periods of wakefulness and agitation.  Goals of care discussions and advanced care planning was discussed with family.  Palliative medicine was ultimately consulted.  After several family meetings, family felt it was in the best interest of the patient to change her focus of care directed toward full comfort given the patient's multiple comorbidities and poor prognosis.  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 -Discontinue opioids and hypnotic medications for now and monitor clinically -Patient remains  somnolent but arousable;does not follow commands -Avoid hypnotic and opioid medications unless absolutely necessary -Unfortunately, patient received a dose of Ativan on 07/18/2019 and 2345 -07/03/19--pt remains somnolent,waxing and waning mentation -check ammonia--13 -MRI brain -EEG--mild to moderate diffuse encephalopathy, non specific to etiology.No seizures or epileptiform discharges -UA--neg for pyuria -ABG--7.47/32/61/24 on RA -repeat narcan--minimal response - patient continued to remain encephalopathic with waxing and waning periods of wakefulness and agitation.  Goals of care discussions and advanced care planning was discussed with family.  Palliative medicine was ultimately consulted.  After several family meetings, family felt it was in the best interest of the patient to change her focus of care directed toward full comfort given the patient's multiple comorbidities and poor prognosis.  Fever -Continue vancomycin pending culture data -Discontinue cefepime as this may also contribute to the patient's encephalopathy -Start Zosyn empirically--concern about aspiration pneumonitis -MRSA screen negative -Personally reviewed chest x-ray--bibasilar atelectasis -CT chest--bilateral LL collapse/consolidation L>R, GGO of RUL -UA negative for pyuria  sepsis -Presented with fever 101.8 F, elevated lactic acid, and altered mental status -due to pnuemonia -Continuedzosyn initially -d/c vanco -UA negative for pyuria -Continue IV fluids -Check procalcitonin--0.10 -sepsis physiology resolved  Pneumonia-lobar -as discussed above  Chronic pain syndrome--malignancy related pain -Holding opioids presently secondary to encephalopathy initially -now focused on full comfort  Stage IIIb colon cancer -Patient [place] -Review of the medical record shows that the patient had opted to discontinue further treatment in July 2020 -now focused on full comfort  Acute urinary  retention -Likely secondary to the patient's opioids and Tylenol PM -foley placed 4/7  Psoriatic arthritis -Chronically on methotrexate and prednisone which are on hold presently  Anxiety -Holding diazepam secondary to altered mental status -now focused on full comfort  Hypokalemia -replete -now focus on full comfort  Goals of Care -consult palliative medicine -currently DNR -patient continued to remain encephalopathic with waxing and waning periods of wakefulness and agitation.  Goals of care discussions and advanced care planning was discussed with family.  Palliative medicine was ultimately consulted.  After several family meetings, family felt it was in the best interest of the patient to change her focus of care directed toward full comfort given the patient's multiple comorbidities and poor prognosis. -continue comfort care measures with dilaudid 0.5 mg q 30 min prn dosing for breakthrough pain.       Disposition Plan: Patient From: Home D/C Place: In-hospital death[facility] residential hospice   Family Communication:son and spouse updated 4/11  Consultants:palliative  Code Status:FULL COMFORT  DVT Prophylaxis: FULL COMFORT   Procedures: As Listed in Progress Note Above  Antibiotics: Zosyn 4/7>>>4/9 vanco4/6>>>4/8      Subjective: Pt with eyes open.  Does not answer questions.  No reports of respiratory distress, n;v,d, uncontrolled pain.  Objective: Vitals:   07/04/19 1930 07/04/19 2000 07/04/19 2040 07/04/19 2040  BP:    134/83  Pulse: 90 90  88  Resp: (!) 25 (!) 24  20  Temp:    98.4 F (36.9 C)  TempSrc:    Oral  SpO2: 92% 92%  95%  Weight:   66.6 kg   Height:        Intake/Output Summary (Last 24 hours) at 07/05/2019 1016 Last data filed at 07/05/2019 [id number] Gross per 24 hour  Intake 0 ml  Output 950 ml  Net -950 ml   Weight change:  Exam:   General:  Pt is alert, does not follows commands appropriately, not in  acute distress  HEENT: No icterus, [place]/AT  Cardiovascular: RRR, S1/S2, no rubs, no gallops  Respiratory: bibasilar rales. No wheeze  Abdomen: Soft/+BS, non tender, non distended, no guarding  Extremities: No edema, No lymphangitis   Data Reviewed: I have personally reviewed following labs and imaging studies Basic Metabolic Panel: Recent Labs  Lab 07/11/2019 1021 07/01/19 0352 07/02/19 0356 07/03/19 0501  NA 139 142 141 141  K 3.7 3.7 3.4* 3.5  CL 103 109 109 109  CO2 24 26 23 23   GLUCOSE 120* 94 91 82  BUN 17 14 10 10   CREATININE 0.79 0.76 0.75 0.73  CALCIUM 9.3 8.4* 8.7* 8.4*   Liver Function Tests: Recent Labs  Lab 07/05/2019 1021 07/02/19 0356 07/03/19 0501  AST 27 30 27   ALT 18 19 17   ALKPHOS 81 62 61  BILITOT 0.4 0.9 0.9  PROT 8.1 5.8* 5.8*  ALBUMIN 4.1 2.8* 2.8*   No results for input(s): LIPASE, AMYLASE in the last 168 hours. Recent Labs  Lab 07/01/19 1403  AMMONIA 13   Coagulation Profile: No results for input(s): INR, PROTIME in the last 168 hours. CBC: Recent Labs  Lab 07/15/2019 1021 07/01/19 0352 07/02/19 0356 07/03/19 0501  WBC 10.0 10.9* 7.8 6.8  HGB 13.2 10.4* 10.3* 10.4*  HCT 45.4 34.8* 33.8* 33.9*  MCV 91.9 89.9 90.1 90.2  PLT 150 141* 152 159   Cardiac Enzymes: Recent Labs  Lab 07/15/2019 1021  CKTOTAL 46   BNP: Invalid input(s): POCBNP CBG: Recent Labs  Lab 07/02/19 2008 07/03/19 0436 07/03/19 0755 07/03/19 1203 07/03/19 1613  GLUCAP 82 72 65* 102* 84   HbA1C: No results for input(s): HGBA1C in the last 72 hours. Urine analysis:    Component Value Date/Time   COLORURINE YELLOW 07/02/2019 [place] 07/10/2019 0954   LABSPEC  1.009 06/27/2019 0954   PHURINE 5.0 06/26/2019 [place] 07/22/2019 0954   HGBUR NEGATIVE 07/05/2019 [place] 07/05/2019 [place] 07/03/2019 0954   PROTEINUR NEGATIVE 07/05/2019 0954   NITRITE NEGATIVE 07/18/2019 0954    LEUKOCYTESUR NEGATIVE 07/18/2019 0954   Sepsis Labs: @LABRCNTIP (procalcitonin:4,lacticidven:4) ) Recent Results (from the past 240 hour(s))  Respiratory Panel by RT PCR (Flu A&B, Covid) - Nasopharyngeal Swab     Status: None   Collection Time: 07/04/2019 11:28 AM   Specimen: Nasopharyngeal Swab  Result Value Ref Range Status   SARS Coronavirus 2 by RT PCR NEGATIVE NEGATIVE Final    Comment: (NOTE) SARS-CoV-2 target nucleic acids are NOT DETECTED. The SARS-CoV-2 RNA is generally detectable in upper respiratoy specimens during the acute phase of infection. The lowest concentration of SARS-CoV-2 viral copies this assay can detect is 131 copies/mL. A negative result does not preclude SARS-Cov-2 infection and should not be used as the sole basis for treatment or other patient management decisions. A negative result may occur with  improper specimen collection/handling, submission of specimen other than nasopharyngeal swab, presence of viral mutation(s) within the areas targeted by this assay, and inadequate number of viral copies (<131 copies/mL). A negative result must be combined with clinical observations, patient history, and epidemiological information. The expected result is Negative. Fact Sheet for Patients:  PinkCheek.be Fact Sheet for Healthcare Providers:  GravelBags.it This test is not yet ap proved or cleared by the [place] FDA and  has been authorized for detection and/or diagnosis of SARS-CoV-2 by FDA under an Emergency Use Authorization (EUA). This EUA will remain  in effect (meaning this test can be used) for the duration of the COVID-19 declaration under Section 564(b)(1) of the Act, 21 U.S.C. section 360bbb-3(b)(1), unless the authorization is terminated or revoked sooner.    Influenza A by PCR NEGATIVE NEGATIVE Final   Influenza B by PCR NEGATIVE NEGATIVE Final    Comment: (NOTE) The Xpert Xpress  SARS-CoV-2/FLU/RSV assay is intended as an aid in  the diagnosis of influenza from Nasopharyngeal swab specimens and  should not be used as a sole basis for treatment. Nasal washings and  aspirates are unacceptable for Xpert Xpress SARS-CoV-2/FLU/RSV  testing. Fact Sheet for Patients: PinkCheek.be Fact Sheet for Healthcare Providers: GravelBags.it This test is not yet approved or cleared by the [place] FDA and  has been authorized for detection and/or diagnosis of SARS-CoV-2 by  FDA under an Emergency Use Authorization (EUA). This EUA will remain  in effect (meaning this test can be used) for the duration of the  Covid-19 declaration under Section 564(b)(1) of the Act, 21  U.S.C. section 360bbb-3(b)(1), unless the authorization is  terminated or revoked. Performed at [facility], [address], [place], [place] [address]   MRSA PCR Screening     Status: None   Collection Time: 07/12/2019 12:45 PM   Specimen: Nasal Mucosa; Nasopharyngeal  Result Value Ref Range Status   MRSA by PCR NEGATIVE NEGATIVE Final    Comment:        The GeneXpert MRSA Assay (FDA approved for NASAL specimens only), is one component of a comprehensive MRSA colonization surveillance program. It is not intended to diagnose MRSA infection nor to guide or monitor treatment for MRSA infections. Performed at [facility], [address] [place],  [address]   Culture, blood (routine x 2)     Status: None (Preliminary result)   Collection Time: 07/17/2019  6:14 PM   Specimen: BLOOD RIGHT HAND  Result Value Ref Range Status   Specimen Description BLOOD RIGHT HAND  Final   Special Requests   Final    BOTTLES DRAWN AEROBIC AND ANAEROBIC Blood Culture adequate volume   Culture   Final    NO GROWTH 4 DAYS Performed at [facility], [address], [place], [place] [address]    Report Status PENDING  Incomplete  Culture, blood (routine x 2)      Status: None (Preliminary result)   Collection Time: 07/09/2019  6:21 PM   Specimen: BLOOD LEFT HAND  Result Value Ref Range Status   Specimen Description BLOOD LEFT HAND  Final   Special Requests   Final    BOTTLES DRAWN AEROBIC ONLY Blood Culture adequate volume   Culture   Final    NO GROWTH 4 DAYS Performed at [facility], [address] [place], [place] [address]    Report Status PENDING  Incomplete     Scheduled Meds: .  HYDROmorphone (DILAUDID) injection  0.5 mg Intravenous Q3H  . scopolamine  1 patch Transdermal Q72H   Continuous Infusions:  Procedures/Studies: DG Chest 1 View  Result Date: 07/02/2019 CLINICAL DATA:  Found unresponsive EXAM: CHEST  1 VIEW COMPARISON:  None. FINDINGS: Right chest wall port with catheter tip overlying the superior right atrium. Patchy bibasilar atelectasis. No significant pleural effusion. No pneumothorax. Cardiomediastinal contours are within limits. Surgical clips overlie the right chest wall. IMPRESSION: Patchy bibasilar atelectasis. Electronically Signed   By: Macy Mis M.D.   On: 07/09/2019 10:57   CT HEAD WO CONTRAST  Result Date: 07/05/2019 CLINICAL DATA:  Found unresponsive EXAM: CT HEAD WITHOUT CONTRAST TECHNIQUE: Contiguous axial images were obtained from the base of the skull through the vertex without intravenous contrast. COMPARISON:  None. FINDINGS: Brain: Chronic atrophic changes are noted. No findings to suggest acute hemorrhage or acute infarction are seen. 10 mm calcified meningioma is noted in the posterior fossa on the left. Vascular: No hyperdense vessel or unexpected calcification. Skull: Normal. Negative for fracture or focal lesion. Sinuses/Orbits: No acute finding. Other: None. IMPRESSION: Chronic atrophic changes. Left posterior fossa meningioma. No acute abnormality noted. Electronically Signed   By: Inez Catalina M.D.   On: 07/07/2019 10:39   CT CHEST WO CONTRAST  Result Date: 07/01/2019 CLINICAL DATA:  Cough with  persistent dyspnea. EXAM: CT CHEST WITHOUT CONTRAST TECHNIQUE: Multidetector CT imaging of the chest was performed following the standard protocol without IV contrast. COMPARISON:  11/02/2014 FINDINGS: Cardiovascular: The heart size is normal. No substantial pericardial effusion. Coronary artery calcification is evident. Atherosclerotic calcification is noted in the wall of the thoracic aorta. Right Port-A-Cath tip is positioned in the upper right atrium. Mediastinum/Nodes: No mediastinal lymphadenopathy. No evidence for gross hilar lymphadenopathy although assessment is limited by the lack of intravenous contrast on today's study. The esophagus has normal imaging features. There is no axillary lymphadenopathy. Lungs/Pleura: Patchy areas of ground-glass attenuation are seen in the right upper lobe with bilateral lower lobe collapse/consolidation, left greater than right. Subpleural reticulation anterior right upper lobe may be related to radiation fibrosis. No substantial pleural effusion. Upper Abdomen: Soft tissue attenuation is identified between the stomach and the lateral segment left liver, potentially bowel but indeterminate on this noncontrast CT. Musculoskeletal: No worrisome lytic or sclerotic osseous abnormality. IMPRESSION: 1. Bilateral lower lobe collapse/consolidation, left greater than right is associated with patchy areas of ground-glass attenuation in the right upper lung. Imaging features suggest multifocal  pneumonia. 2. Soft tissue attenuation between the stomach and the lateral segment left liver, potentially bowel but somewhat atypical in appearance on coronal reconstructions. Dedicated abdomen/pelvis CT with oral and intravenous contrast (renal function permitting) recommended to further evaluate. 3. Aortic Atherosclerosis (ICD10-I70.0). Electronically Signed   By: Misty Stanley M.D.   On: 07/01/2019 14:44   MR BRAIN WO CONTRAST  Result Date: 07/02/2019 CLINICAL DATA:  Encephalopathy. EXAM:  MRI HEAD WITHOUT CONTRAST TECHNIQUE: Multiplanar, multiecho pulse sequences of the brain and surrounding structures were obtained without intravenous contrast. COMPARISON:  Head CT June 30, 2019 FINDINGS: Severely degraded images by motion. Patient unable to hold still for the duration of the study. Brain: No large acute infarction, hemorrhage, hydrocephalus or extra-axial collection. 1.4 cm dural-based lesion in the left cerebellopontine angle cistern corresponds to the meningioma seen on prior CT. Vascular: Grossly maintained. Skull and upper cervical spine: Unable to assess due to motion artifact. Sinuses/Orbits: Grossly normal. IMPRESSION: 1. Severely degraded images due to motion artifact. Patient unable to hold still for the duration of the study. 2. No acute intracranial abnormality identified. 3. A 1.4 cm dural-based lesion in the left cerebellopontine angle cistern corresponds to the meningioma seen on prior CT. Electronically Signed   By: Pedro Earls M.D.   On: 07/02/2019 14:45   EEG adult  Result Date: 07/03/2019 Lora Havens, MD     07/03/2019 10:47 AM Patient Name: Rajani [place] MRN: [id number] Epilepsy Attending: Lora Havens Referring Physician/Provider: Dr Shanon Brow Daja Shuping Date: 07/02/2019 Duration: 24 mins Patient history: 79yo F admitted for ams. EEG to evaluate for seizure Level of alertness: awake AEDs during EEG study: None Technical aspects: This EEG study was done with scalp electrodes positioned according to the 10-20 International system of electrode placement. Electrical activity was acquired at a sampling rate of 500Hz  and reviewed with a high frequency filter of 70Hz  and a low frequency filter of 1Hz . EEG data were recorded continuously and digitally stored. DESCRIPTION: No clear posterior dominant rhythm was seen. EEG showed generalized polymorphic 8-9hz  alpha activity as well as intermittent 2-3hz  low amplitude delta activity. Hyperventilation and photic stimulation  were not performed. ABNORMALITY - Intermittent slow, generalized IMPRESSION: This study is suggestive of mild to moderate diffuse encephalopathy, non specific to etiology. No seizures or epileptiform discharges were seen throughout the recording. Kirkman, DO  [facility]  If 7PM-7AM, please contact night-coverage [email or web address] Password [email or web address] 07/05/2019, 10:16 AM   LOS: 4 days

## 2019-07-25 NOTE — Plan of Care (Signed)
  Problem: Safety: Goal: Ability to remain free from injury will improve Outcome: Progressing   Problem: Nutrition: Goal: Adequate nutrition will be maintained Outcome: Not Applicable Comfort care measures in place

## 2019-07-25 NOTE — Death Summary Note (Signed)
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 -Discontinue opioids and hypnotic medications for now and monitor clinically -Patient remains somnolent but arousable;does not follow commands -Avoid hypnotic and opioid medications unless absolutely necessary -Unfortunately, patient received a dose of Ativan on 2019/07/18 and 07-05-2343 -07/03/19--pt remains somnolent,waxing and waning mentation -check ammonia--13 -MRI brain -EEG--mild to moderate diffuse encephalopathy, non specific to etiology.No seizures or epileptiform discharges -UA--neg for pyuria -ABG--7.47/32/61/24 on RA -repeat narcan--minimal response - patient continued to remain encephalopathic with waxing and waning periods of wakefulness and agitation. Goals of care discussions and advanced care planning was discussed with family. Palliative medicine was ultimately consulted. After several family meetings, family felt it was in the best interest of the patient to change her focus of care directed toward full comfort given the patient's multiple comorbidities and poor prognosis.  Fever -Continue vancomycin pending culture data -Discontinue cefepime as this may also contribute to the patient's encephalopathy -Start Zosyn empirically--concern about aspiration pneumonitis -MRSA screen negative -Personally reviewed chest  x-ray--bibasilar atelectasis -CT chest--bilateral LL collapse/consolidation L>R, GGO of RUL -UA negative for pyuria  sepsis -Presented with fever 101.8 F, elevated lactic acid, and altered mental status -due to pnuemonia -Continuedzosyninitially -d/c vanco -UA negative for pyuria -Continue IV fluids -Check procalcitonin--0.10 -sepsis physiology resolved  Pneumonia-lobar -as discussed above -now focused on full comfort  Chronic pain syndrome--malignancy related pain -Holding opioids presently secondary to encephalopathyinitially -now focused on full comfort  Stage IIIb colon cancer -Patient [place] -Review of the medical record shows that the patient had opted to discontinue further treatment in July 2020 -now focused on full comfort  Acute urinary retention -Likely secondary to the patient's opioids and Tylenol PM -foley placed 4/7  Psoriatic arthritis -Chronically on methotrexate and prednisone which are on hold   Anxiety -Holding diazepam secondary to altered mental status -now focused on full comfort  Hypokalemia -replete -now focus on full comfort  Goals of Care -consult palliative medicine -currently DNR -patient continued to remain encephalopathic with waxing and waning periods of wakefulness and agitation. Goals of care discussions and advanced care planning was discussed with family. Palliative medicine was ultimately consulted. After several family meetings, family felt it was in the best interest of the patient to change her focus of care directed toward full comfort given the patient's multiple comorbidities and poor prognosis. -continue comfort care measures with dilaudid 0.5 mg q 30 min prn dosing for breakthrough pain.  Brief Hospital Course (including significant findings, care, treatment, and services provided and events leading to death)  Chelsea Mayo is a 79 year old female with a history of stage IIIb colon cancer,  hyperlipidemia, anxiety, chronic pain related to malignancy, psoriatic arthritis presenting with altered mental status. Patient's husband reports that4/5/21evening, she was in her usual state of health. She did complain of some nausea. It is unclear whether she took Zofran or accidentally took extra oxycodone for symptoms.On the morning of admission, with husband woke up, he noticed that he was unable to wake up  the patient. EMS was called. When rescue arrived, they started to bag her. EMS provided Narcan which reportedly have some improvement in mental status. Husband reports that she had not been ill recently. No shortness of breath, cough or fever. On arrival to the emergency room, she was noted to be somnolent, pinpoint pupils. She received several more doses of Narcan with temporary improvement. She was started on Narcan infusion referred for admission.After admission, reversible causes of encephalopathy were investigated and addressed. However, the patient continued to remain encephalopathic with waxing and waning periods of wakefulness and agitation. Goals of care discussions and advanced care planning was discussed with family. Palliative medicine was ultimately consulted. After several family meetings, family felt it was in the best interest of the patient to change her focus of care directed toward full comfort given the patient's multiple comorbidities and poor prognosis.    Pertinent Labs and Studies  Significant Diagnostic Studies DG Chest 1 View  Result Date: 07/23/2019 CLINICAL DATA:  Found unresponsive EXAM: CHEST  1 VIEW COMPARISON:  None. FINDINGS: Right chest wall port with catheter tip overlying the superior right atrium. Patchy bibasilar atelectasis. No significant pleural effusion. No pneumothorax. Cardiomediastinal contours are within limits. Surgical clips overlie the right chest wall. IMPRESSION: Patchy bibasilar atelectasis. Electronically Signed   By: Macy Mis M.D.   On: 07/03/2019 10:57   CT HEAD WO CONTRAST  Result Date: 07/07/2019 CLINICAL DATA:  Found unresponsive EXAM: CT HEAD WITHOUT CONTRAST TECHNIQUE: Contiguous axial images were obtained from the base of the skull through the vertex without intravenous contrast. COMPARISON:  None. FINDINGS: Brain: Chronic atrophic changes are noted. No findings to suggest acute hemorrhage or acute infarction are seen. 10 mm calcified meningioma is noted in the posterior fossa on the left. Vascular: No hyperdense vessel or unexpected calcification. Skull: Normal. Negative for fracture or focal lesion. Sinuses/Orbits: No acute finding. Other: None. IMPRESSION: Chronic atrophic changes. Left posterior fossa meningioma. No acute abnormality noted. Electronically Signed   By: Inez Catalina M.D.   On: 07/23/2019 10:39   CT CHEST WO CONTRAST  Result Date: 07/01/2019 CLINICAL DATA:  Cough with persistent dyspnea. EXAM: CT CHEST WITHOUT CONTRAST TECHNIQUE: Multidetector CT imaging of the chest was performed following the standard protocol without IV contrast. COMPARISON:  11/02/2014 FINDINGS: Cardiovascular: The heart size is normal. No substantial pericardial effusion. Coronary artery calcification is evident. Atherosclerotic calcification is noted in the wall of the thoracic aorta. Right Port-A-Cath tip is positioned in the upper right atrium. Mediastinum/Nodes: No mediastinal lymphadenopathy. No evidence for gross hilar lymphadenopathy although assessment is limited by the lack of intravenous contrast on today's study. The esophagus has normal imaging features. There is no axillary lymphadenopathy. Lungs/Pleura: Patchy areas of ground-glass attenuation are seen in the right upper lobe with bilateral lower lobe collapse/consolidation, left greater than right. Subpleural reticulation anterior right upper lobe may be related to radiation fibrosis. No substantial pleural effusion. Upper Abdomen: Soft tissue attenuation is  identified between the stomach and the lateral segment left liver, potentially bowel but indeterminate on this noncontrast CT. Musculoskeletal: No worrisome lytic or sclerotic osseous abnormality. IMPRESSION: 1. Bilateral lower lobe collapse/consolidation, left greater than right is associated with patchy areas of ground-glass attenuation in the right upper lung. Imaging features suggest multifocal pneumonia. 2. Soft tissue attenuation between the stomach and the lateral segment left liver, potentially bowel but somewhat atypical in appearance on coronal reconstructions. Dedicated abdomen/pelvis CT with oral and intravenous contrast (renal function permitting) recommended to further  evaluate. 3. Aortic Atherosclerosis (ICD10-I70.0). Electronically Signed   By: Misty Stanley M.D.   On: 07/01/2019 14:44   MR BRAIN WO CONTRAST  Result Date: 07/02/2019 CLINICAL DATA:  Encephalopathy. EXAM: MRI HEAD WITHOUT CONTRAST TECHNIQUE: Multiplanar, multiecho pulse sequences of the brain and surrounding structures were obtained without intravenous contrast. COMPARISON:  Head CT June 30, 2019 FINDINGS: Severely degraded images by motion. Patient unable to hold still for the duration of the study. Brain: No large acute infarction, hemorrhage, hydrocephalus or extra-axial collection. 1.4 cm dural-based lesion in the left cerebellopontine angle cistern corresponds to the meningioma seen on prior CT. Vascular: Grossly maintained. Skull and upper cervical spine: Unable to assess due to motion artifact. Sinuses/Orbits: Grossly normal. IMPRESSION: 1. Severely degraded images due to motion artifact. Patient unable to hold still for the duration of the study. 2. No acute intracranial abnormality identified. 3. A 1.4 cm dural-based lesion in the left cerebellopontine angle cistern corresponds to the meningioma seen on prior CT. Electronically Signed   By: Pedro Earls M.D.   On: 07/02/2019 14:45   EEG adult  Result  Date: 07/03/2019 Lora Havens, MD     07/03/2019 10:47 AM Patient Name: Indiyah Bubak MRN: [id number] Epilepsy Attending: Lora Havens Referring Physician/Provider: Dr Shanon Brow Konner Saiz Date: 07/02/2019 Duration: 24 mins Patient history: 79yo F admitted for ams. EEG to evaluate for seizure Level of alertness: awake AEDs during EEG study: None Technical aspects: This EEG study was done with scalp electrodes positioned according to the 10-20 International system of electrode placement. Electrical activity was acquired at a sampling rate of 500Hz  and reviewed with a high frequency filter of 70Hz  and a low frequency filter of 1Hz . EEG data were recorded continuously and digitally stored. DESCRIPTION: No clear posterior dominant rhythm was seen. EEG showed generalized polymorphic 8-9hz  alpha activity as well as intermittent 2-3hz  low amplitude delta activity. Hyperventilation and photic stimulation were not performed. ABNORMALITY - Intermittent slow, generalized IMPRESSION: This study is suggestive of mild to moderate diffuse encephalopathy, non specific to etiology. No seizures or epileptiform discharges were seen throughout the recording. Lora Havens    Microbiology Recent Results (from the past 240 hour(s))  Respiratory Panel by RT PCR (Flu A&B, Covid) - Nasopharyngeal Swab     Status: None   Collection Time: 07/19/2019 11:28 AM   Specimen: Nasopharyngeal Swab  Result Value Ref Range Status   SARS Coronavirus 2 by RT PCR NEGATIVE NEGATIVE Final    Comment: (NOTE) SARS-CoV-2 target nucleic acids are NOT DETECTED. The SARS-CoV-2 RNA is generally detectable in upper respiratoy specimens during the acute phase of infection. The lowest concentration of SARS-CoV-2 viral copies this assay can detect is 131 copies/mL. A negative result does not preclude SARS-Cov-2 infection and should not be used as the sole basis for treatment or other patient management decisions. A negative result may occur with  improper  specimen collection/handling, submission of specimen other than nasopharyngeal swab, presence of viral mutation(s) within the areas targeted by this assay, and inadequate number of viral copies (<131 copies/mL). A negative result must be combined with clinical observations, patient history, and epidemiological information. The expected result is Negative. Fact Sheet for Patients:  PinkCheek.be Fact Sheet for Healthcare Providers:  GravelBags.it This test is not yet ap proved or cleared by the [place] FDA and  has been authorized for detection and/or diagnosis of SARS-CoV-2 by FDA under an Emergency Use Authorization (EUA). This EUA will remain  in effect (meaning  this test can be used) for the duration of the COVID-19 declaration under Section 564(b)(1) of the Act, 21 U.S.C. section 360bbb-3(b)(1), unless the authorization is terminated or revoked sooner.    Influenza A by PCR NEGATIVE NEGATIVE Final   Influenza B by PCR NEGATIVE NEGATIVE Final    Comment: (NOTE) The Xpert Xpress SARS-CoV-2/FLU/RSV assay is intended as an aid in  the diagnosis of influenza from Nasopharyngeal swab specimens and  should not be used as a sole basis for treatment. Nasal washings and  aspirates are unacceptable for Xpert Xpress SARS-CoV-2/FLU/RSV  testing. Fact Sheet for Patients: PinkCheek.be Fact Sheet for Healthcare Providers: GravelBags.it This test is not yet approved or cleared by the [place] FDA and  has been authorized for detection and/or diagnosis of SARS-CoV-2 by  FDA under an Emergency Use Authorization (EUA). This EUA will remain  in effect (meaning this test can be used) for the duration of the  Covid-19 declaration under Section 564(b)(1) of the Act, 21  U.S.C. section 360bbb-3(b)(1), unless the authorization is  terminated or revoked. Performed at [facility], [address], [place], [place] [address]   MRSA PCR Screening     Status: None   Collection Time: 07/10/2019 12:45 PM   Specimen: Nasal Mucosa; Nasopharyngeal  Result Value Ref Range Status   MRSA by PCR NEGATIVE NEGATIVE Final    Comment:        The GeneXpert MRSA Assay (FDA approved for NASAL specimens only), is one component of a comprehensive MRSA colonization surveillance program. It is not intended to diagnose MRSA infection nor to guide or monitor treatment for MRSA infections. Performed at [facility], [address] [place], [place] [address]   Culture, blood (routine x 2)     Status: None   Collection Time: 07/20/2019  6:14 PM   Specimen: BLOOD RIGHT HAND  Result Value Ref Range Status   Specimen Description BLOOD RIGHT HAND  Final   Special Requests   Final    BOTTLES DRAWN AEROBIC AND ANAEROBIC Blood Culture adequate volume   Culture   Final    NO GROWTH 5 DAYS Performed at [facility], [address], [place], [place] [address]    Report Status 07/05/2019 FINAL  Final  Culture, blood (routine x 2)     Status: None   Collection Time: 06/29/2019  6:21 PM   Specimen: BLOOD LEFT HAND  Result Value Ref Range Status   Specimen Description BLOOD LEFT HAND  Final   Special Requests   Final    BOTTLES DRAWN AEROBIC ONLY Blood Culture adequate volume   Culture   Final    NO GROWTH 5 DAYS Performed at [facility], [address] [place], [place] [address]    Report Status 07/05/2019 FINAL  Final    Lab Basic Metabolic Panel: Recent Labs  Lab 07/01/19 0352 07/02/19 0356 07/03/19 0501  NA 142 141 141  K 3.7 3.4* 3.5  CL 109 109 109  CO2 26 23 23   GLUCOSE 94 91 82  BUN 14 10 10   CREATININE 0.76 0.75 0.73  CALCIUM 8.4* 8.7* 8.4*   Liver Function Tests: Recent Labs  Lab 07/02/19 0356 07/03/19 0501  AST 30 27  ALT 19 17  ALKPHOS 62 61  BILITOT 0.9 0.9  PROT 5.8* 5.8*  ALBUMIN 2.8* 2.8*   No results for input(s): LIPASE, AMYLASE in the last 168  hours. Recent Labs  Lab 07/01/19 1403  AMMONIA 13   CBC: Recent Labs  Lab 07/01/19 0352 07/02/19 0356 07/03/19 0501  WBC 10.9* 7.8 6.8  HGB 10.4* 10.3* 10.4*  HCT 34.8* 33.8* 33.9*  MCV 89.9 90.1 90.2  PLT 141* 152 159   Cardiac Enzymes: No results for input(s): CKTOTAL, CKMB, CKMBINDEX, TROPONINI in the last 168 hours. Sepsis Labs: Recent Labs  Lab 06/29/2019 1821 07/16/2019 2130 07/01/19 0352 07/01/19 1403 07/02/19 0356 07/03/19 0501  PROCALCITON  --   --   --  0.10  --  <0.10  WBC  --   --  10.9*  --  7.8 6.8  LATICACIDVEN 2.8* 3.1*  --   --  1.1  --     Procedures/Operations     Shanon Brow Alexy Heldt 07/07/2019, 5:25 PM

## 2019-07-25 NOTE — Progress Notes (Signed)
PROGRESS NOTE  Chelsea Mayo [id number] DOB: Jun 26, 1940 DOA: 07/24/2019 PCP: Kennyth Arnold, FNP   Brief History: 79 year old female with a history of stage IIIb colon cancer, hyperlipidemia, anxiety, chronic pain related to malignancy, psoriatic arthritis presenting with altered mental status. Patient's husband reports that4/5/21evening, she was in her usual state of health. She did complain of some nausea. It is unclear whether she took Zofran or accidentally took extra oxycodone for symptoms.On the morning of admission, with husband woke up, he noticed that he was unable to wake up the patient. EMS was called. When rescue arrived, they started to bag her. EMS provided Narcan which reportedly have some improvement in mental status. Husband reports that she had not been ill recently. No shortness of breath, cough or fever. On arrival to the emergency room, she was noted to be somnolent, pinpoint pupils. She received several more doses of Narcan with temporary improvement. She was started on Narcan infusion referred for admission.After admission, reversible causes of encephalopath were investigated and addressed. However, the patient continued to remain encephalopathic with waxing and waning periods of wakefulness and agitation. Goals of care discussions and advanced care planning was discussed with family. Palliative medicine was ultimately consulted. After several family meetings, family felt it was in the best interest of the patient to change her focus of care directed toward full comfort given the patient's multiple comorbidities and poor prognosis.  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 -Discontinue opioids and hypnotic medications for now and monitor clinically -Patient remains  somnolent but arousable;does not follow commands -Avoid hypnotic and opioid medications unless absolutely necessary -Unfortunately, patient received a dose of Ativan on 07/01/2019 and 2345 -07/03/19--pt remains somnolent,waxing and waning mentation -check ammonia--13 -MRI brain -EEG--mild to moderate diffuse encephalopathy, non specific to etiology.No seizures or epileptiform discharges -UA--neg for pyuria -ABG--7.47/32/61/24 on RA -repeat narcan--minimal response - patient continued to remain encephalopathic with waxing and waning periods of wakefulness and agitation. Goals of care discussions and advanced care planning was discussed with family. Palliative medicine was ultimately consulted. After several family meetings, family felt it was in the best interest of the patient to change her focus of care directed toward full comfort given the patient's multiple comorbidities and poor prognosis.  Fever -Continue vancomycin pending culture data -Discontinue cefepime as this may also contribute to the patient's encephalopathy -Start Zosyn empirically--concern about aspiration pneumonitis -MRSA screen negative -Personally reviewed chest x-ray--bibasilar atelectasis -CT chest--bilateral LL collapse/consolidation L>R, GGO of RUL -UA negative for pyuria  sepsis -Presented with fever 101.8 F, elevated lactic acid, and altered mental status -due to pnuemonia -Continuedzosyninitially -d/c vanco -UA negative for pyuria -Continue IV fluids -Check procalcitonin--0.10 -sepsis physiology resolved  Pneumonia-lobar -as discussed above  Chronic pain syndrome--malignancy related pain -Holding opioids presently secondary to encephalopathyinitially -now focused on full comfort  Stage IIIb colon cancer -Patient [place] -Review of the medical record shows that the patient had opted to discontinue further treatment in July 2020 -now focused on full comfort  Acute urinary  retention -Likely secondary to the patient's opioids and Tylenol PM -foley placed 4/7  Psoriatic arthritis -Chronically on methotrexate and prednisone which are on hold presently  Anxiety -Holding diazepam secondary to altered mental status -now focused on full comfort  Hypokalemia -replete -now focus on full comfort  Goals of Care -consult palliative medicine -currently DNR -patient continued to remain  encephalopathic with waxing and waning periods of wakefulness and agitation. Goals of care discussions and advanced care planning was discussed with family. Palliative medicine was ultimately consulted. After several family meetings, family felt it was in the best interest of the patient to change her focus of care directed toward full comfort given the patient's multiple comorbidities and poor prognosis. -continue comfort care measures with dilaudid 0.5 mg q 30 min prn dosing for breakthrough pain.       Disposition Plan: Patient From: Home D/C Place:In-hospital deathv residential hospice   Family Communication:sonand spouse updated 4/11  Consultants:palliative  Code Status:FULL COMFORT  DVT Prophylaxis: FULL COMFORT   Procedures: As Listed in Progress Note Above  Antibiotics: Zosyn 4/7>>>4/9 vanco4/6>>>4/8     Subjective: Pt is somnolent.  No distress. No vomiting or diarrhea.  No uncontrolled pain  Objective: Vitals:   07/04/19 2040 07/04/19 2040 07/05/19 2048 2019-07-08 1448  BP:  134/83 (!) 146/84 130/75  Pulse:  88 97 (!) 110  Resp:  20 19 (!) 26  Temp:  98.4 F (36.9 C) 99.9 F (37.7 C) 97.6 F (36.4 C)  TempSrc:  Oral  Axillary  SpO2:  95% (!) 89% (!) 87%  Weight: 66.6 kg     Height:        Intake/Output Summary (Last 24 hours) at 07/08/19 1701 Last data filed at 07/08/19 1200 Gross per 24 hour  Intake 0 ml  Output 1000 ml  Net -1000 ml   Weight change:  Exam:   General:  Pt is somnolent, does not  follow commands appropriately, not in acute distress  HEENT: No icterus,[place]/AT  Cardiovascular: RRR, S1/S2, no rubs, no gallops  Respiratory: bilateral scattered rales  Abdomen: Soft/+BS,  non distended  Extremities: No edema   Data Reviewed: I have personally reviewed following labs and imaging studies Basic Metabolic Panel: Recent Labs  Lab 07/24/2019 1021 07/01/19 0352 07/02/19 0356 07/03/19 0501  NA 139 142 141 141  K 3.7 3.7 3.4* 3.5  CL 103 109 109 109  CO2 24 26 23 23   GLUCOSE 120* 94 91 82  BUN 17 14 10 10   CREATININE 0.79 0.76 0.75 0.73  CALCIUM 9.3 8.4* 8.7* 8.4*   Liver Function Tests: Recent Labs  Lab 07/09/2019 1021 07/02/19 0356 07/03/19 0501  AST 27 30 27   ALT 18 19 17   ALKPHOS 81 62 61  BILITOT 0.4 0.9 0.9  PROT 8.1 5.8* 5.8*  ALBUMIN 4.1 2.8* 2.8*   No results for input(s): LIPASE, AMYLASE in the last 168 hours. Recent Labs  Lab 07/01/19 1403  AMMONIA 13   Coagulation Profile: No results for input(s): INR, PROTIME in the last 168 hours. CBC: Recent Labs  Lab 06/29/2019 1021 07/01/19 0352 07/02/19 0356 07/03/19 0501  WBC 10.0 10.9* 7.8 6.8  HGB 13.2 10.4* 10.3* 10.4*  HCT 45.4 34.8* 33.8* 33.9*  MCV 91.9 89.9 90.1 90.2  PLT 150 141* 152 159   Cardiac Enzymes: Recent Labs  Lab 07/17/2019 1021  CKTOTAL 46   BNP: Invalid input(s): POCBNP CBG: Recent Labs  Lab 07/02/19 2008 07/03/19 0436 07/03/19 0755 07/03/19 1203 07/03/19 1613  GLUCAP 82 72 65* 102* 84   HbA1C: No results for input(s): HGBA1C in the last 72 hours. Urine analysis:    Component Value Date/Time   COLORURINE YELLOW 07/05/2019 [place] 06/29/2019 0954   LABSPEC 1.009 07/10/2019 0954   PHURINE 5.0 07/11/2019 [place] 07/16/2019 0954   HGBUR NEGATIVE 07/14/2019  [place] 07/14/2019 [place] 07/14/2019 [place] 07/01/2019 0954   NITRITE NEGATIVE 07/16/2019 0954    LEUKOCYTESUR NEGATIVE 07/17/2019 0954   Sepsis Labs: @LABRCNTIP (procalcitonin:4,lacticidven:4) ) Recent Results (from the past 240 hour(s))  Respiratory Panel by RT PCR (Flu A&B, Covid) - Nasopharyngeal Swab     Status: None   Collection Time: 07/22/2019 11:28 AM   Specimen: Nasopharyngeal Swab  Result Value Ref Range Status   SARS Coronavirus 2 by RT PCR NEGATIVE NEGATIVE Final    Comment: (NOTE) SARS-CoV-2 target nucleic acids are NOT DETECTED. The SARS-CoV-2 RNA is generally detectable in upper respiratoy specimens during the acute phase of infection. The lowest concentration of SARS-CoV-2 viral copies this assay can detect is 131 copies/mL. A negative result does not preclude SARS-Cov-2 infection and should not be used as the sole basis for treatment or other patient management decisions. A negative result may occur with  improper specimen collection/handling, submission of specimen other than nasopharyngeal swab, presence of viral mutation(s) within the areas targeted by this assay, and inadequate number of viral copies (<131 copies/mL). A negative result must be combined with clinical observations, patient history, and epidemiological information. The expected result is Negative. Fact Sheet for Patients:  PinkCheek.be Fact Sheet for Healthcare Providers:  GravelBags.it This test is not yet ap proved or cleared by the [place] FDA and  has been authorized for detection and/or diagnosis of SARS-CoV-2 by FDA under an Emergency Use Authorization (EUA). This EUA will remain  in effect (meaning this test can be used) for the duration of the COVID-19 declaration under Section 564(b)(1) of the Act, 21 U.S.C. section 360bbb-3(b)(1), unless the authorization is terminated or revoked sooner.    Influenza A by PCR NEGATIVE NEGATIVE Final   Influenza B by PCR NEGATIVE NEGATIVE Final    Comment: (NOTE) The Xpert Xpress  SARS-CoV-2/FLU/RSV assay is intended as an aid in  the diagnosis of influenza from Nasopharyngeal swab specimens and  should not be used as a sole basis for treatment. Nasal washings and  aspirates are unacceptable for Xpert Xpress SARS-CoV-2/FLU/RSV  testing. Fact Sheet for Patients: PinkCheek.be Fact Sheet for Healthcare Providers: GravelBags.it This test is not yet approved or cleared by the [place] FDA and  has been authorized for detection and/or diagnosis of SARS-CoV-2 by  FDA under an Emergency Use Authorization (EUA). This EUA will remain  in effect (meaning this test can be used) for the duration of the  Covid-19 declaration under Section 564(b)(1) of the Act, 21  U.S.C. section 360bbb-3(b)(1), unless the authorization is  terminated or revoked. Performed at [facility], [address], [place], [place] [address]   MRSA PCR Screening     Status: None   Collection Time: 06/28/2019 12:45 PM   Specimen: Nasal Mucosa; Nasopharyngeal  Result Value Ref Range Status   MRSA by PCR NEGATIVE NEGATIVE Final    Comment:        The GeneXpert MRSA Assay (FDA approved for NASAL specimens only), is one component of a comprehensive MRSA colonization surveillance program. It is not intended to diagnose MRSA infection nor to guide or monitor treatment for MRSA infections. Performed at [facility], [address] [place], [place] [address]   Culture, blood (routine x 2)     Status: None   Collection Time: 07/05/2019  6:14 PM   Specimen: BLOOD RIGHT HAND  Result Value Ref Range Status   Specimen Description BLOOD RIGHT HAND  Final   Special Requests   Final    BOTTLES DRAWN AEROBIC AND ANAEROBIC Blood Culture adequate volume   Culture   Final    NO GROWTH 5 DAYS Performed at [facility], [address] [place], [place] [address]    Report Status 07/05/2019 FINAL  Final  Culture, blood (routine x 2)     Status: None    Collection Time: 07/21/2019  6:21 PM   Specimen: BLOOD LEFT HAND  Result Value Ref Range Status   Specimen Description BLOOD LEFT HAND  Final   Special Requests   Final    BOTTLES DRAWN AEROBIC ONLY Blood Culture adequate volume   Culture   Final    NO GROWTH 5 DAYS Performed at [facility], [address], [place],  [address]    Report Status 07/05/2019 FINAL  Final     Scheduled Meds: .  HYDROmorphone (DILAUDID) injection  0.5 mg Intravenous Q3H  . scopolamine  1 patch Transdermal Q72H   Continuous Infusions:  Procedures/Studies: DG Chest 1 View  Result Date: 07/10/2019 CLINICAL DATA:  Found unresponsive EXAM: CHEST  1 VIEW COMPARISON:  None. FINDINGS: Right chest wall port with catheter tip overlying the superior right atrium. Patchy bibasilar atelectasis. No significant pleural effusion. No pneumothorax. Cardiomediastinal contours are within limits. Surgical clips overlie the right chest wall. IMPRESSION: Patchy bibasilar atelectasis. Electronically Signed   By: Macy Mis M.D.   On: 06/29/2019 10:57   CT HEAD WO CONTRAST  Result Date: 06/25/2019 CLINICAL DATA:  Found unresponsive EXAM: CT HEAD WITHOUT CONTRAST TECHNIQUE: Contiguous axial images were obtained from the base of the skull through the vertex without intravenous contrast. COMPARISON:  None. FINDINGS: Brain: Chronic atrophic changes are noted. No findings to suggest acute hemorrhage or acute infarction are seen. 10 mm calcified meningioma is noted in the posterior fossa on the left. Vascular: No hyperdense vessel or unexpected calcification. Skull: Normal. Negative for fracture or focal lesion. Sinuses/Orbits: No acute finding. Other: None. IMPRESSION: Chronic atrophic changes. Left posterior fossa meningioma. No acute abnormality noted. Electronically Signed   By: Inez Catalina M.D.   On: 06/27/2019 10:39   CT CHEST WO CONTRAST  Result Date: 07/01/2019 CLINICAL DATA:  Cough with persistent dyspnea. EXAM: CT CHEST  WITHOUT CONTRAST TECHNIQUE: Multidetector CT imaging of the chest was performed following the standard protocol without IV contrast. COMPARISON:  11/02/2014 FINDINGS: Cardiovascular: The heart size is normal. No substantial pericardial effusion. Coronary artery calcification is evident. Atherosclerotic calcification is noted in the wall of the thoracic aorta. Right Port-A-Cath tip is positioned in the upper right atrium. Mediastinum/Nodes: No mediastinal lymphadenopathy. No evidence for gross hilar lymphadenopathy although assessment is limited by the lack of intravenous contrast on today's study. The esophagus has normal imaging features. There is no axillary lymphadenopathy. Lungs/Pleura: Patchy areas of ground-glass attenuation are seen in the right upper lobe with bilateral lower lobe collapse/consolidation, left greater than right. Subpleural reticulation anterior right upper lobe may be related to radiation fibrosis. No substantial pleural effusion. Upper Abdomen: Soft tissue attenuation is identified between the stomach and the lateral segment left liver, potentially bowel but indeterminate on this noncontrast CT. Musculoskeletal: No worrisome lytic or sclerotic osseous abnormality. IMPRESSION: 1. Bilateral lower lobe collapse/consolidation, left greater than right is associated with patchy areas of ground-glass attenuation in the right upper lung. Imaging features suggest multifocal pneumonia. 2. Soft tissue attenuation between the stomach and the lateral segment left liver, potentially bowel but somewhat atypical in appearance on  coronal reconstructions. Dedicated abdomen/pelvis CT with oral and intravenous contrast (renal function permitting) recommended to further evaluate. 3. Aortic Atherosclerosis (ICD10-I70.0). Electronically Signed   By: Misty Stanley M.D.   On: 07/01/2019 14:44   MR BRAIN WO CONTRAST  Result Date: 07/02/2019 CLINICAL DATA:  Encephalopathy. EXAM: MRI HEAD WITHOUT CONTRAST  TECHNIQUE: Multiplanar, multiecho pulse sequences of the brain and surrounding structures were obtained without intravenous contrast. COMPARISON:  Head CT June 30, 2019 FINDINGS: Severely degraded images by motion. Patient unable to hold still for the duration of the study. Brain: No large acute infarction, hemorrhage, hydrocephalus or extra-axial collection. 1.4 cm dural-based lesion in the left cerebellopontine angle cistern corresponds to the meningioma seen on prior CT. Vascular: Grossly maintained. Skull and upper cervical spine: Unable to assess due to motion artifact. Sinuses/Orbits: Grossly normal. IMPRESSION: 1. Severely degraded images due to motion artifact. Patient unable to hold still for the duration of the study. 2. No acute intracranial abnormality identified. 3. A 1.4 cm dural-based lesion in the left cerebellopontine angle cistern corresponds to the meningioma seen on prior CT. Electronically Signed   By: Pedro Earls M.D.   On: 07/02/2019 14:45   EEG adult  Result Date: 07/03/2019 Lora Havens, MD     07/03/2019 10:47 AM Patient Name: Tatiana Tippens MRN: [id number] Epilepsy Attending: Lora Havens Referring Physician/Provider: Dr Shanon Brow Norrin Shreffler Date: 07/02/2019 Duration: 24 mins Patient history: 79yo F admitted for ams. EEG to evaluate for seizure Level of alertness: awake AEDs during EEG study: None Technical aspects: This EEG study was done with scalp electrodes positioned according to the 10-20 International system of electrode placement. Electrical activity was acquired at a sampling rate of 500Hz  and reviewed with a high frequency filter of 70Hz  and a low frequency filter of 1Hz . EEG data were recorded continuously and digitally stored. DESCRIPTION: No clear posterior dominant rhythm was seen. EEG showed generalized polymorphic 8-9hz  alpha activity as well as intermittent 2-3hz  low amplitude delta activity. Hyperventilation and photic stimulation were not performed.  ABNORMALITY - Intermittent slow, generalized IMPRESSION: This study is suggestive of mild to moderate diffuse encephalopathy, non specific to etiology. No seizures or epileptiform discharges were seen throughout the recording. [place], DO  [facility]  If 7PM-7AM, please contact night-coverage [email or web address] Password [email or web address] Jul 14, 2019, 5:01 PM   LOS: 5 days

## 2019-07-25 NOTE — Progress Notes (Signed)
Pt passed away at 2150- 2 RNs verified patients death. Son in room right now and calling the family.  This RN called [employer] and patient was a full release. Spoke with Ronney Asters to make aware- Referral # [phone]   [facility] called and made aware.

## 2019-07-25 DEATH — deceased

## 2021-06-02 IMAGING — MR MR HEAD W/O CM
8 of 10 series · 34 of 48 positions shown · non-contrast
Comparison: Head CT June 30, 2019

CLINICAL DATA: Encephalopathy.

EXAM:
MRI HEAD WITHOUT CONTRAST
TECHNIQUE: Multiplanar, multiecho pulse sequences of the brain and surrounding
structures were obtained without intravenous contrast.

[Series 2: T1 · sagittal · 5.0mm · 0.42mm/px · 3 of 20 slices shown (1 of 2)]
[im 1/20]
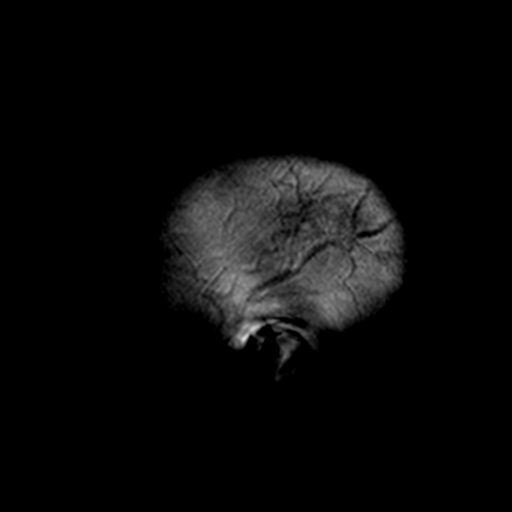
[im 10/20]
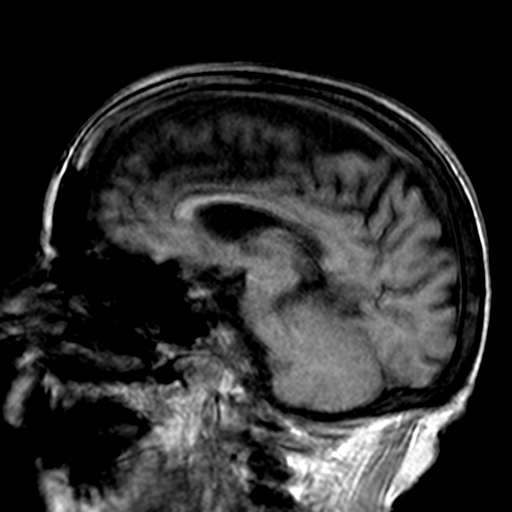
[im 20/20]
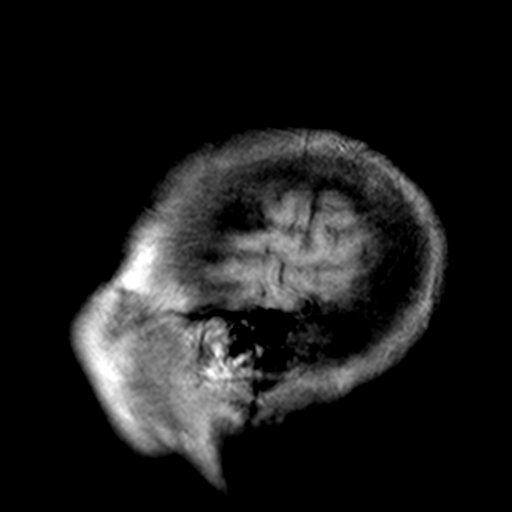

[Series 3: DWI · axial · 3.0mm · 0.82mm/px · z∈[-101,+57]mm · 6 of 53 slices shown (1 of 2)]
[im 1/53]
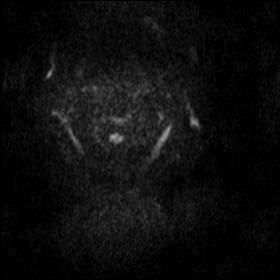
[im 11/53]
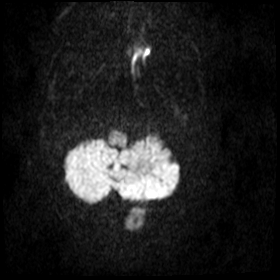
[im 21/53]
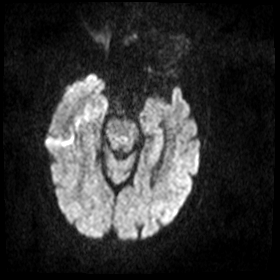
[im 32/53]
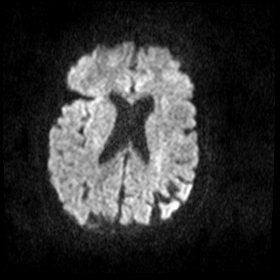
[im 42/53]
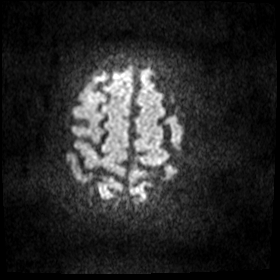
[im 53/53]
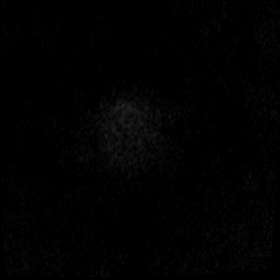

[Series 5: DWI · coronal · 5.0mm · 0.49mm/px · 4 of 34 slices shown (2 of 2)]
[im 1/34]
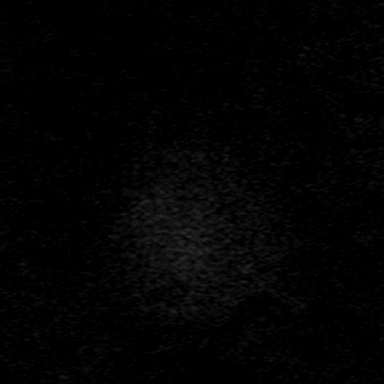
[im 12/34]
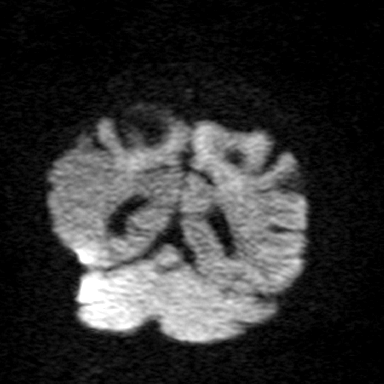
[im 23/34]
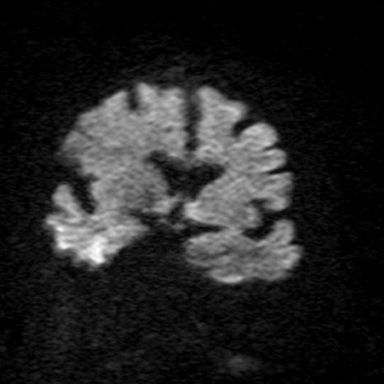
[im 34/34]
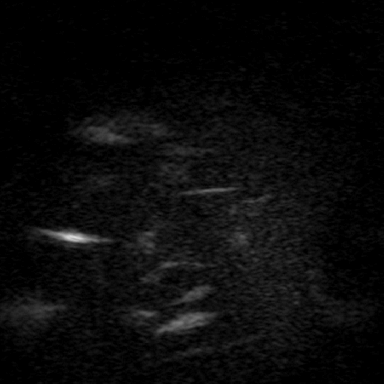

[Series 7: T2 · axial · 5.0mm · 0.71mm/px · z∈[-95,+47]mm · 3 of 23 slices shown (1 of 3)]
[im 1/23]
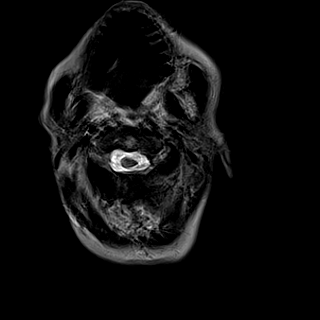
[im 12/23]
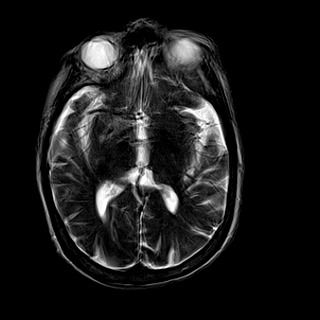
[im 23/23]
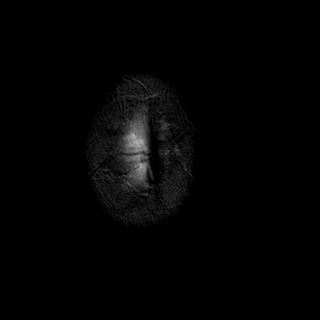

[Series 8: FLAIR · axial · 3.0mm · 0.88mm/px · z∈[-92,+45]mm · 5 of 47 slices shown]
[im 1/47]
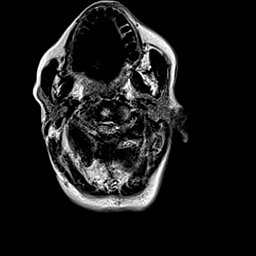
[im 12/47]
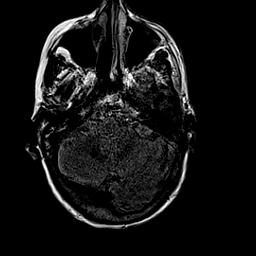
[im 24/47]
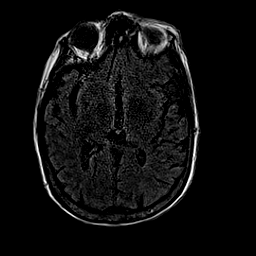
[im 35/47]
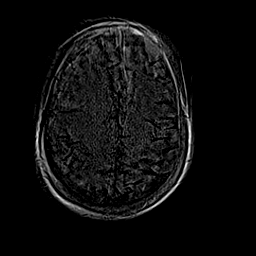
[im 47/47]
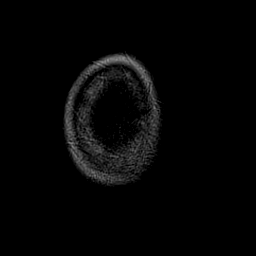

[Series 9: T2 · axial · 5.0mm · 0.42mm/px · z∈[-89,+40]mm · 2 of 21 slices shown (2 of 3)]
[im 1/21]
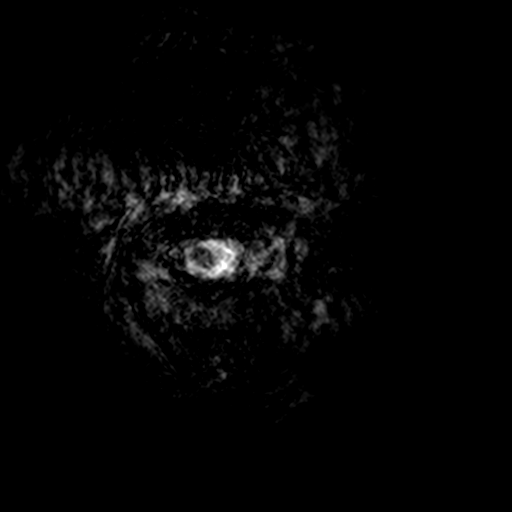
[im 21/21]
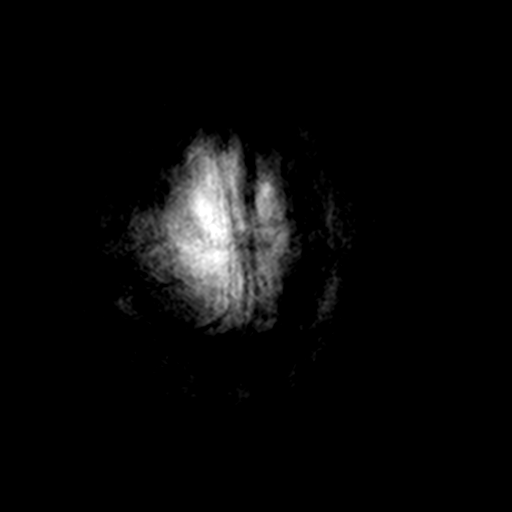

[Series 10: T1 · axial · 2.0mm · 0.43mm/px · z∈[-126,+78]mm · 8 of 104 slices shown (2 of 2)]
[im 1/104]
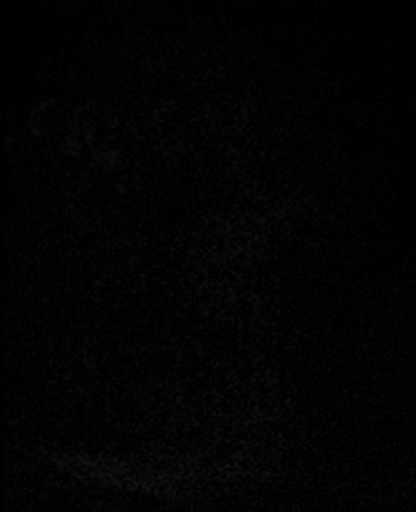
[im 19/104]
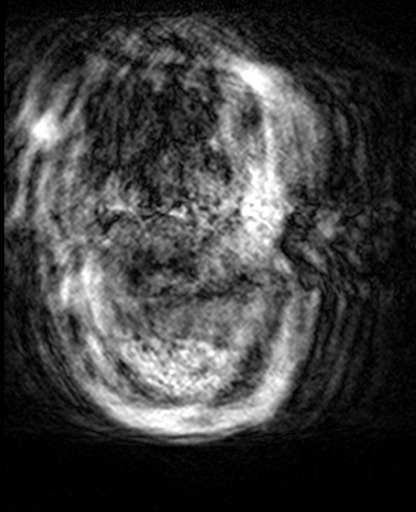
[im 29/104]
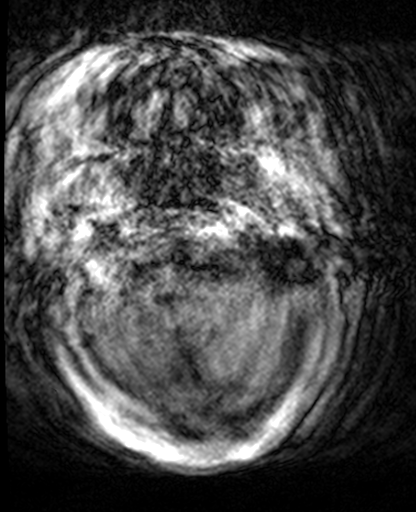
[im 47/104]
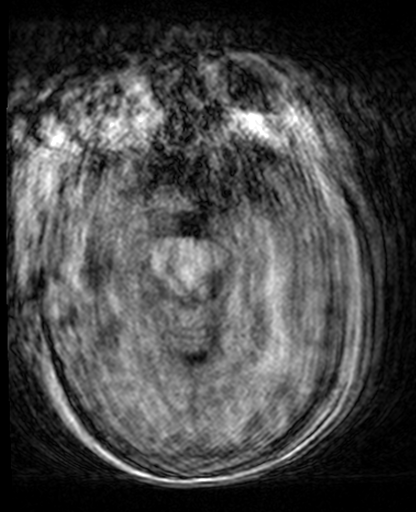
[im 57/104]
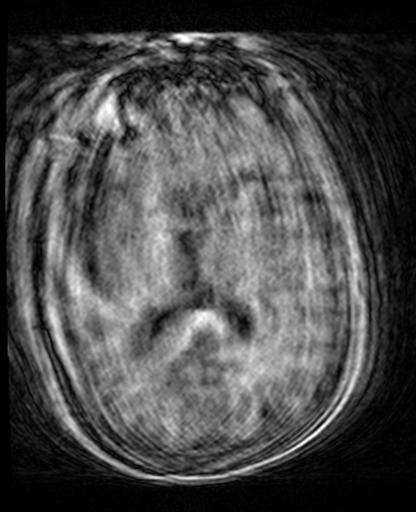
[im 75/104]
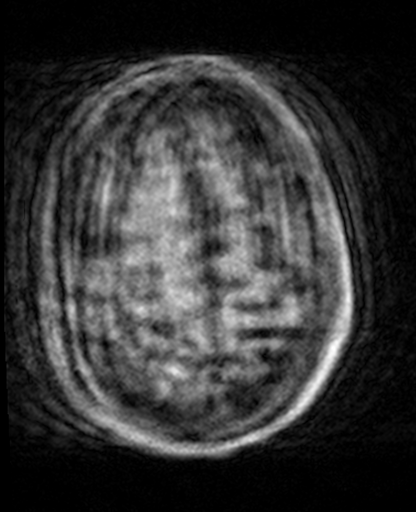
[im 85/104]
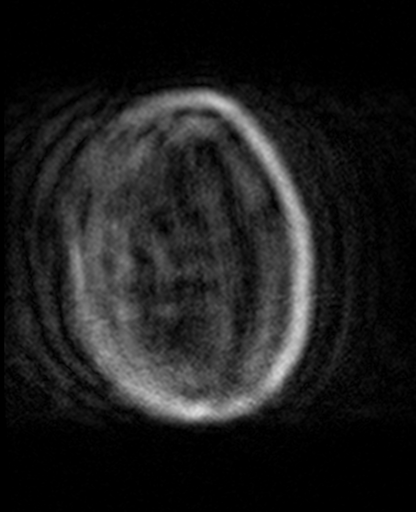
[im 104/104]
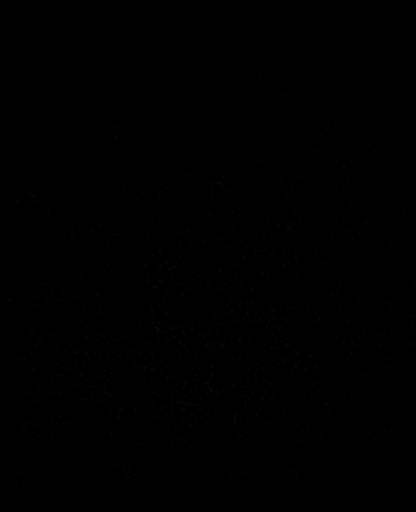

[Series 11: T2 · coronal · 5.0mm · 0.62mm/px · 3 of 28 slices shown (3 of 3)]
[im 1/28]
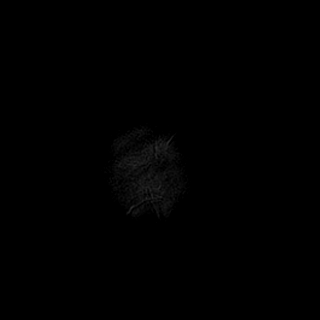
[im 14/28]
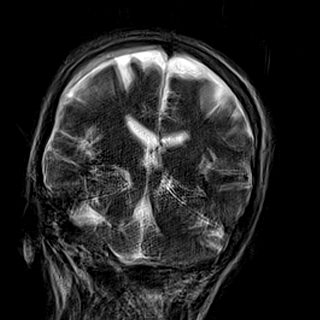
[im 28/28]
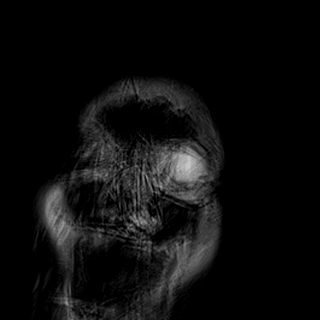

[34 of 48 positions shown; findings below may reference images not displayed]

FINDINGS: Severely degraded images by motion. Patient unable to hold still for
the duration of the study.

Brain: No large acute infarction, hemorrhage, hydrocephalus or
extra-axial collection.

1.4 cm dural-based lesion in the left cerebellopontine angle cistern
corresponds to the meningioma seen on prior CT.

Vascular: Grossly maintained.

Skull and upper cervical spine: Unable to assess due to motion
artifact.

Sinuses/Orbits: Grossly normal.
IMPRESSION: 1. Severely degraded images due to motion artifact. Patient unable
to hold still for the duration of the study.
2. No acute intracranial abnormality identified.
3. A 1.4 cm dural-based lesion in the left cerebellopontine angle
cistern corresponds to the meningioma seen on prior CT.
